# Patient Record
Sex: Male | Born: 1995
Health system: Southern US, Community
[De-identification: ages and names within clinical notes are randomized; demographics above are authoritative.]

## PROBLEM LIST (undated history)

## (undated) DIAGNOSIS — D229 Melanocytic nevi, unspecified: Secondary | ICD-10-CM

## (undated) HISTORY — DX: Melanocytic nevi, unspecified: D22.9

---
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
## 2014-01-18 ENCOUNTER — Encounter: Payer: Self-pay | Admitting: Internal Medicine

## 2014-01-18 ENCOUNTER — Ambulatory Visit (INDEPENDENT_AMBULATORY_CARE_PROVIDER_SITE_OTHER): Payer: Self-pay | Admitting: Internal Medicine

## 2014-01-18 VITALS — BP 98/62 | HR 71 | Temp 98.2°F | Wt 132.8 lb

## 2014-01-18 DIAGNOSIS — T148XXA Other injury of unspecified body region, initial encounter: Secondary | ICD-10-CM

## 2014-01-18 MED ORDER — NAPROXEN 250 MG PO TABS: 250.0000 mg | ORAL_TABLET | Freq: Two times a day (BID) | ORAL | Status: DC

## 2014-01-18 NOTE — Progress Notes (Signed)
Pre visit review using our clinic review tool, if applicable. No additional management support is needed unless otherwise documented below in the visit note. 

## 2014-01-18 NOTE — Patient Instructions (Addendum)
Muscle Strain A muscle strain is an injury that occurs when a muscle is stretched beyond its normal length. Usually a small number of muscle fibers are torn when this happens. Muscle strain is rated in degrees. First-degree strains have the least amount of muscle fiber tearing and pain. Second-degree and third-degree strains have increasingly more tearing and pain.  Usually, recovery from muscle strain takes 1-2 weeks. Complete healing takes 5-6 weeks.  CAUSES  Muscle strain happens when a sudden, violent force placed on a muscle stretches it too far. This may occur with lifting, sports, or a fall.  RISK FACTORS Muscle strain is especially common in athletes.  SIGNS AND SYMPTOMS At the site of the muscle strain, there may be:  Pain.  Bruising.  Swelling.  Difficulty using the muscle due to pain or lack of normal function. DIAGNOSIS  Your health care provider will perform a physical exam and ask about your medical history. TREATMENT  Often, the best treatment for a muscle strain is resting, icing, and applying cold compresses to the injured area.  HOME CARE INSTRUCTIONS   Use the PRICE method of treatment to promote muscle healing during the first 2-3 days after your injury. The PRICE method involves:  Protecting the muscle from being injured again.  Restricting your activity and resting the injured body part.  Icing your injury. To do this, put ice in a plastic bag. Place a towel between your skin and the bag. Then, apply the ice and leave it on from 15-20 minutes each hour. After the third day, switch to moist heat packs.  Apply compression to the injured area with a splint or elastic bandage. Be careful not to wrap it too tightly. This may interfere with blood circulation or increase swelling.  Elevate the injured body part above the level of your heart as often as you can.  Only take over-the-counter or prescription medicines for pain, discomfort, or fever as directed by your  health care provider.  Warming up prior to exercise helps to prevent future muscle strains. SEEK MEDICAL CARE IF:   You have increasing pain or swelling in the injured area.  You have numbness, tingling, or a significant loss of strength in the injured area. MAKE SURE YOU:   Understand these instructions.  Will watch your condition.  Will get help right away if you are not doing well or get worse. Document Released: 05/13/2005 Document Revised: 03/03/2013 Document Reviewed: 12/10/2012 [facility] Patient Information 2015 [place], [place]. This information is not intended to replace advice given to you by your health care provider. Make sure you discuss any questions you have with your health care provider. Back Exercises These exercises may help you when beginning to rehabilitate your injury. Your symptoms may resolve with or without further involvement from your physician, physical therapist or athletic trainer. While completing these exercises, remember:   Restoring tissue flexibility helps normal motion to return to the joints. This allows healthier, less painful movement and activity.  An effective stretch should be held for at least 30 seconds.  A stretch should never be painful. You should only feel a gentle lengthening or release in the stretched tissue. STRETCH - Extension, Prone on Elbows   Lie on your stomach on the floor, a bed will be too soft. Place your palms about shoulder width apart and at the height of your head.  Place your elbows under your shoulders. If this is too painful, stack pillows under your chest.  Allow your body to relax  so that your hips drop lower and make contact more completely with the floor.  Hold this position for __________ seconds.  Slowly return to lying flat on the floor. Repeat __________ times. Complete this exercise __________ times per day.  RANGE OF MOTION - Extension, Prone Press Ups   Lie on your stomach on the floor, a bed will be  too soft. Place your palms about shoulder width apart and at the height of your head.  Keeping your back as relaxed as possible, slowly straighten your elbows while keeping your hips on the floor. You may adjust the placement of your hands to maximize your comfort. As you gain motion, your hands will come more underneath your shoulders.  Hold this position __________ seconds.  Slowly return to lying flat on the floor. Repeat __________ times. Complete this exercise __________ times per day.  RANGE OF MOTION- Quadruped, Neutral Spine   Assume a hands and knees position on a firm surface. Keep your hands under your shoulders and your knees under your hips. You may place padding under your knees for comfort.  Drop your head and point your tail bone toward the ground below you. This will round out your low back like an angry cat. Hold this position for __________ seconds.  Slowly lift your head and release your tail bone so that your back sags into a large arch, like an old horse.  Hold this position for __________ seconds.  Repeat this until you feel limber in your low back.  Now, find your "sweet spot." This will be the most comfortable position somewhere between the two previous positions. This is your neutral spine. Once you have found this position, tense your stomach muscles to support your low back.  Hold this position for __________ seconds. Repeat __________ times. Complete this exercise __________ times per day.  STRETCH - Flexion, Single Knee to Chest   Lie on a firm bed or floor with both legs extended in front of you.  Keeping one leg in contact with the floor, bring your opposite knee to your chest. Hold your leg in place by either grabbing behind your thigh or at your knee.  Pull until you feel a gentle stretch in your low back. Hold __________ seconds.  Slowly release your grasp and repeat the exercise with the opposite side. Repeat __________ times. Complete this  exercise __________ times per day.  STRETCH - Hamstrings, Standing  Stand or sit and extend your right / left leg, placing your foot on a chair or foot stool  Keeping a slight arch in your low back and your hips straight forward.  Lead with your chest and lean forward at the waist until you feel a gentle stretch in the back of your right / left knee or thigh. (When done correctly, this exercise requires leaning only a small distance.)  Hold this position for __________ seconds. Repeat __________ times. Complete this stretch __________ times per day. STRENGTHENING - Deep Abdominals, Pelvic Tilt   Lie on a firm bed or floor. Keeping your legs in front of you, bend your knees so they are both pointed toward the ceiling and your feet are flat on the floor.  Tense your lower abdominal muscles to press your low back into the floor. This motion will rotate your pelvis so that your tail bone is scooping upwards rather than pointing at your feet or into the floor.  With a gentle tension and even breathing, hold this position for __________ seconds. Repeat __________  times. Complete this exercise __________ times per day.  STRENGTHENING - Abdominals, Crunches   Lie on a firm bed or floor. Keeping your legs in front of you, bend your knees so they are both pointed toward the ceiling and your feet are flat on the floor. Cross your arms over your chest.  Slightly tip your chin down without bending your neck.  Tense your abdominals and slowly lift your trunk high enough to just clear your shoulder blades. Lifting higher can put excessive stress on the low back and does not further strengthen your abdominal muscles.  Control your return to the starting position. Repeat __________ times. Complete this exercise __________ times per day.  STRENGTHENING - Quadruped, Opposite UE/LE Lift   Assume a hands and knees position on a firm surface. Keep your hands under your shoulders and your knees under your  hips. You may place padding under your knees for comfort.  Find your neutral spine and gently tense your abdominal muscles so that you can maintain this position. Your shoulders and hips should form a rectangle that is parallel with the floor and is not twisted.  Keeping your trunk steady, lift your right hand no higher than your shoulder and then your left leg no higher than your hip. Make sure you are not holding your breath. Hold this position __________ seconds.  Continuing to keep your abdominal muscles tense and your back steady, slowly return to your starting position. Repeat with the opposite arm and leg. Repeat __________ times. Complete this exercise __________ times per day. Document Released: 05/31/2005 Document Revised: 08/05/2011 Document Reviewed: 08/25/2008 [facility] Patient Information 2015 [place], [place]. This information is not intended to replace advice given to you by your health care provider. Make sure you discuss any questions you have with your health care provider.

## 2014-01-18 NOTE — Progress Notes (Signed)
Subjective:    Patient ID: Eduardo Whitaker, male    DOB: [birth year], 18 y.o.   MRN: [id number]  HPI  Pt presents to the clinic today with pain in the right side of his back. This started 1 week ago. He describes the pain as achy. He denies any specific injury to the area but reports that he has been working out.  Review of Systems      Past Medical History  Diagnosis Date  . Atypical nevus     prev removed by derm    No current outpatient prescriptions on file.   No current facility-administered medications for this visit.    No Known Allergies  Family History  Problem Relation Age of Onset  . Depression Mother   . Hypertension Father   . Prostate cancer Maternal Grandfather   . Colon cancer Neg Hx     History   Social History  . Marital Status: Single    Spouse Name: N/A    Number of Children: N/A  . Years of Education: N/A   Occupational History  . Not on file.   Social History Main Topics  . Smoking status: Never Smoker   . Smokeless tobacco: Never Used  . Alcohol Use: No  . Drug Use: No  . Sexual Activity: Not on file   Other Topics Concern  . Not on file   Social History Narrative   Visual merchandiser, criminal justice   Likes to hunt, work out     Constitutional: Denies fever, malaise, fatigue, headache or abrupt weight changes.  GU: Denies urgency, frequency, pain with urination, burning sensation, blood in urine, odor or discharge. Musculoskeletal: Pt reports back pain.  Denies decrease in range of motion, difficulty with gait, or joint pain and swelling.    No other specific complaints in a complete review of systems (except as listed in HPI above).  Objective:   Physical Exam   BP 98/62  Pulse 71  Temp(Src) 98.2 F (36.8 C) (Oral)  Wt 132 lb 12 oz (60.215 kg)  SpO2 98% Wt Readings from Last 3 Encounters:  01/18/14 132 lb 12 oz (60.215 kg) (16%*, Z = -1.01)  12/21/13 135 lb (61.236 kg) (19%*, Z = -0.88)   * Growth percentiles are  based on CDC 2-20 Years data.    General: Appears his stated age, well developed, well nourished in NAD. Cardiovascular: Normal rate and rhythm. S1,S2 noted.  No murmur, rubs or gallops noted. No JVD or BLE edema. No carotid bruits noted. Pulmonary/Chest: Normal effort and positive vesicular breath sounds. No respiratory distress. No wheezes, rales or ronchi noted.  Musculoskeletal: Normal flexion, extension and rotation of the spine. He does have pain with rotation to the left and flexion. Nontender with palpation.     BMET    Component Value Date/Time   NA 137 12/08/2010 2126   K 4.5 12/08/2010 2126   CL 102 12/08/2010 2126   CO2 24 12/08/2010 2126   GLUCOSE 83 12/08/2010 2126   BUN 19 12/08/2010 2126   CREATININE 0.69 12/08/2010 2126   CALCIUM 10.1 12/08/2010 2126   GFRNONAA NOT CALCULATED 12/08/2010 2126   GFRAA NOT CALCULATED 12/08/2010 2126    Lipid Panel  No results found for this basename: chol, trig, hdl, cholhdl, vldl, ldlcalc    CBC    Component Value Date/Time   WBC 11.4 12/08/2010 2126   RBC 6.45* 12/08/2010 2126   HGB 17.7* 12/08/2010 2126   HCT 49.8* 12/08/2010 2126  PLT PLATELET CLUMPS NOTED ON SMEAR, UNABLE TO ESTIMATE 12/08/2010 2126   MCV 77.2* 12/08/2010 2126   MCH 27.4 12/08/2010 2126   MCHC 35.5 12/08/2010 2126   RDW 13.0 12/08/2010 2126   LYMPHSABS 1.8 12/08/2010 2126   MONOABS 0.9 12/08/2010 2126   EOSABS 0.1 12/08/2010 2126   BASOSABS 0.0 12/08/2010 2126    Hgb A1C No results found for this basename: HGBA1C        Assessment & Plan:   Muscle Strain of back:  Try Naproxen BID for the next 3 days with meals Stretching exercises given Avoid any heavy lifting or upper body workouts for the next week A heating pad may be helpful  RTC as needed or if symptoms persist > 2 more weeks

## 2014-01-28 ENCOUNTER — Encounter: Payer: Self-pay | Admitting: Family Medicine

## 2014-03-04 ENCOUNTER — Encounter: Payer: Self-pay | Admitting: Family Medicine

## 2014-03-04 ENCOUNTER — Ambulatory Visit (INDEPENDENT_AMBULATORY_CARE_PROVIDER_SITE_OTHER): Payer: Self-pay | Admitting: Family Medicine

## 2014-03-04 VITALS — BP 128/66 | HR 66 | Temp 98.1°F | Wt 134.2 lb

## 2014-03-04 DIAGNOSIS — J069 Acute upper respiratory infection, unspecified: Secondary | ICD-10-CM

## 2014-03-04 MED ORDER — BENZONATATE 200 MG PO CAPS: 200.0000 mg | ORAL_CAPSULE | Freq: Three times a day (TID) | ORAL | Status: DC | PRN

## 2014-03-04 NOTE — Progress Notes (Signed)
Pre visit review using our clinic review tool, if applicable. No additional management support is needed unless otherwise documented below in the visit note.  Sx started about 4-5 days ago.  Had ST at that point. Sinus drainage also. Stuffy and runny nose the next day.  Start mucinex at that point. Now with AM cough and sputum, dark green. Less sputum later in the day.  No fevers.  No wheeze.  No ear pain, no vomiting.  No rash.  Hoarse.  Frequently clearing throat.  He is less stuffy now.   Meds, vitals, and allergies reviewed.   ROS: See HPI.  Otherwise, noncontributory.  GEN: nad, alert and oriented HEENT: mucous membranes moist, tm w/o erythema, nasal exam w/o erythema, clear discharge noted,  OP with cobblestoning, sinuses not ttp NECK: supple w/o LA CV: rrr.   PULM: ctab, no inc wob EXT: no edema SKIN: no acute rash

## 2014-03-04 NOTE — Patient Instructions (Signed)
Use tessalon for the cough.   Take mucinex with plenty of fluids.  Rest your voice.  Try to get some rest.  Glad to see you.  You should get better in a few more days.

## 2014-03-06 DIAGNOSIS — J069 Acute upper respiratory infection, unspecified: Secondary | ICD-10-CM | POA: Insufficient documentation

## 2014-03-06 NOTE — Assessment & Plan Note (Signed)
Likely viral, nontoxic, already feels some better today.  See instructions.  D/w pt.

## 2014-05-17 ENCOUNTER — Ambulatory Visit: Payer: Medicaid Other | Admitting: Family Medicine

## 2014-08-22 ENCOUNTER — Telehealth: Payer: Self-pay | Admitting: Family Medicine

## 2014-08-22 DIAGNOSIS — R6889 Other general symptoms and signs: Secondary | ICD-10-CM

## 2014-08-22 NOTE — Telephone Encounter (Signed)
Mom advised

## 2014-08-22 NOTE — Telephone Encounter (Signed)
Referral placed.  I don't know how quickly he can be seen there.  If he can't be seen soon, we can see him in the office first.  I presume she [facility], Dr. Pryor Ochoa.

## 2014-08-22 NOTE — Telephone Encounter (Signed)
Mom called wanting to get a referral to ent Dr Mackie Pai stated his ears are stopped up and needs appointment asap Please call mom with date and time of appointment No insurance Mom called to make appointment she stated he needs referral

## 2014-08-26 ENCOUNTER — Emergency Department (HOSPITAL_BASED_OUTPATIENT_CLINIC_OR_DEPARTMENT_OTHER)
Admission: EM | Admit: 2014-08-26 | Discharge: 2014-08-26 | Disposition: A | Discharge status: Home / Self Care | Payer: Medicaid Other | Attending: Emergency Medicine | Admitting: Emergency Medicine

## 2014-08-26 ENCOUNTER — Emergency Department (HOSPITAL_BASED_OUTPATIENT_CLINIC_OR_DEPARTMENT_OTHER): Payer: Medicaid Other

## 2014-08-26 ENCOUNTER — Encounter (HOSPITAL_BASED_OUTPATIENT_CLINIC_OR_DEPARTMENT_OTHER): Payer: Self-pay | Admitting: *Deleted

## 2014-08-26 DIAGNOSIS — X58XXXA Exposure to other specified factors, initial encounter: Secondary | ICD-10-CM | POA: Diagnosis not present

## 2014-08-26 DIAGNOSIS — Z79899 Other long term (current) drug therapy: Secondary | ICD-10-CM | POA: Insufficient documentation

## 2014-08-26 DIAGNOSIS — Y9289 Other specified places as the place of occurrence of the external cause: Secondary | ICD-10-CM | POA: Diagnosis not present

## 2014-08-26 DIAGNOSIS — Y9389 Activity, other specified: Secondary | ICD-10-CM | POA: Insufficient documentation

## 2014-08-26 DIAGNOSIS — Y998 Other external cause status: Secondary | ICD-10-CM | POA: Insufficient documentation

## 2014-08-26 DIAGNOSIS — S63617A Unspecified sprain of left little finger, initial encounter: Secondary | ICD-10-CM | POA: Insufficient documentation

## 2014-08-26 DIAGNOSIS — Z86018 Personal history of other benign neoplasm: Secondary | ICD-10-CM | POA: Insufficient documentation

## 2014-08-26 DIAGNOSIS — R110 Nausea: Secondary | ICD-10-CM | POA: Diagnosis not present

## 2014-08-26 DIAGNOSIS — S63619A Unspecified sprain of unspecified finger, initial encounter: Secondary | ICD-10-CM

## 2014-08-26 DIAGNOSIS — S6992XA Unspecified injury of left wrist, hand and finger(s), initial encounter: Secondary | ICD-10-CM | POA: Diagnosis present

## 2014-08-26 DIAGNOSIS — Z791 Long term (current) use of non-steroidal anti-inflammatories (NSAID): Secondary | ICD-10-CM | POA: Diagnosis not present

## 2014-08-26 NOTE — ED Notes (Signed)
Pt was exercising with a tractor tire and injured his left pinky finger.

## 2014-08-26 NOTE — ED Provider Notes (Signed)
CSN: [id number]     Arrival date & time 08/26/14  1929 History   First MD Initiated Contact with Patient 08/26/14 1947     Chief Complaint  Patient presents with  . Hand Pain     (Consider location/radiation/quality/duration/timing/severity/associated sxs/prior Treatment) Patient is a 19 y.o. male presenting with hand pain. The history is provided by the patient. No language interpreter was used.  Hand Pain This is a new problem. The current episode started today. The problem occurs constantly. The problem has been unchanged. Associated symptoms include joint swelling, myalgias and nausea. Nothing aggravates the symptoms. He has tried nothing for the symptoms. The treatment provided moderate relief.  Pt complains of pain in little finger,  Pt was flipped a tractor tire and finger was pulled backwards  Past Medical History  Diagnosis Date  . Atypical nevus     prev removed by derm   History reviewed. No pertinent past surgical history. Family History  Problem Relation Age of Onset  . Depression Mother   . Hypertension Father   . Prostate cancer Maternal Grandfather   . Colon cancer Neg Hx    History  Substance Use Topics  . Smoking status: Never Smoker   . Smokeless tobacco: Never Used  . Alcohol Use: No    Review of Systems  Gastrointestinal: Positive for nausea.  Musculoskeletal: Positive for myalgias and joint swelling.  All other systems reviewed and are negative.     Allergies  Review of patient's allergies indicates no known allergies.  Home Medications   Prior to Admission medications   Medication Sig Start Date End Date Taking? Authorizing Provider  benzonatate (TESSALON) 200 MG capsule Take 1 capsule (200 mg total) by mouth 3 (three) times daily as needed. 03/04/14   Tonia Ghent, MD  naproxen (NAPROSYN) 250 MG tablet Take 1 tablet (250 mg total) by mouth 2 (two) times daily with a meal. 01/18/14   Jearld Fenton, NP   BP 131/84 mmHg  Pulse 60  Temp(Src)  98.2 F (36.8 C) (Oral)  Resp 18  Ht 5\' 7"  (1.702 m)  Wt 135 lb (61.236 kg)  BMI 21.14 kg/m2  SpO2 100% Physical Exam  Constitutional: He appears well-developed and well-nourished.  Musculoskeletal: He exhibits tenderness.  Swollen left 5th finger.  From  Ns and nv intact  Neurological: He is alert.  Skin: Skin is warm.  Psychiatric: He has a normal mood and affect.  Nursing note and vitals reviewed.   ED Course  Procedures (including critical care time) Labs Review Labs Reviewed - No data to display  Imaging Review Dg Finger Little Left  08/26/2014   CLINICAL DATA:  Injured while exercising. Pain at the MCP joint region.  EXAM: LEFT LITTLE FINGER 2+V  COMPARISON:  None  FINDINGS: No evidence of fracture or dislocation.  IMPRESSION: Negative.   Electronically Signed   By: Nelson Chimes M.D.   On: 08/26/2014 20:28     EKG Interpretation None      MDM   Final diagnoses:  Sprain of finger, left, initial encounter    Splint Ibuprofen Follow up with Dr. Barbaraann Barthel in 4-5 days    Fransico Meadow, PA-C 08/26/14 2036  Alfonzo Beers, MD 08/26/14 2037

## 2014-08-26 NOTE — Telephone Encounter (Signed)
08/26/14-Called [facility]; pt has [place] Medicaid and has [facility] listed as his PCP. Spoke to mother about this. She states she thought he did not have medicaid anymore but she is going to look into that and let us know. I informed pt mother we cannot do any referrals for patients who have [place] Medicaid. Pt mother verbalized understanding. Referral cancelled -arc

## 2014-08-26 NOTE — Discharge Instructions (Signed)
Finger Sprain  A finger sprain is a tear in one of the strong, fibrous tissues that connect the bones (ligaments) in your finger. The severity of the sprain depends on how much of the ligament is torn. The tear can be either partial or complete.  CAUSES   Often, sprains are a result of a fall or accident. If you extend your hands to catch an object or to protect yourself, the force of the impact causes the fibers of your ligament to stretch too much. This excess tension causes the fibers of your ligament to tear.  SYMPTOMS   You may have some loss of motion in your finger. Other symptoms include:   Bruising.   Tenderness.   Swelling.  DIAGNOSIS   In order to diagnose finger sprain, your caregiver will physically examine your finger or thumb to determine how torn the ligament is. Your caregiver may also suggest an X-ray exam of your finger to make sure no bones are broken.  TREATMENT   If your ligament is only partially torn, treatment usually involves keeping the finger in a fixed position (immobilization) for a short period. To do this, your caregiver will apply a bandage, cast, or splint to keep your finger from moving until it heals. For a partially torn ligament, the healing process usually takes 2 to 3 weeks.  If your ligament is completely torn, you may need surgery to reconnect the ligament to the bone. After surgery a cast or splint will be applied and will need to stay on your finger or thumb for 4 to 6 weeks while your ligament heals.  HOME CARE INSTRUCTIONS   Keep your injured finger elevated, when possible, to decrease swelling.   To ease pain and swelling, apply ice to your joint twice a day, for 2 to 3 days:   Put ice in a plastic bag.   Place a towel between your skin and the bag.   Leave the ice on for 15 minutes.   Only take over-the-counter or prescription medicine for pain as directed by your caregiver.   Do not wear rings on your injured finger.   Do not leave your finger unprotected  until pain and stiffness go away (usually 3 to 4 weeks).   Do not allow your cast or splint to get wet. Cover your cast or splint with a plastic bag when you shower or bathe. Do not swim.   Your caregiver may suggest special exercises for you to do during your recovery to prevent or limit permanent stiffness.  SEEK IMMEDIATE MEDICAL CARE IF:   Your cast or splint becomes damaged.   Your pain becomes worse rather than better.  MAKE SURE YOU:   Understand these instructions.   Will watch your condition.   Will get help right away if you are not doing well or get worse.  Document Released: 06/20/2004 Document Revised: 08/05/2011 Document Reviewed: 01/14/2011  [employer] Patient Information 2015 [employer]. This information is not intended to replace advice given to you by your health care provider. Make sure you discuss any questions you have with your health care provider.

## 2014-09-09 ENCOUNTER — Other Ambulatory Visit (INDEPENDENT_AMBULATORY_CARE_PROVIDER_SITE_OTHER): Payer: Medicaid Other

## 2014-09-09 ENCOUNTER — Other Ambulatory Visit: Payer: Self-pay | Admitting: Family Medicine

## 2014-09-09 DIAGNOSIS — Z131 Encounter for screening for diabetes mellitus: Secondary | ICD-10-CM

## 2014-09-09 DIAGNOSIS — Z13220 Encounter for screening for lipoid disorders: Secondary | ICD-10-CM

## 2014-09-09 LAB — LIPID PANEL
CHOL/HDL RATIO: 2
Cholesterol: 93 mg/dL (ref 0–200)
HDL: 40 mg/dL (ref >39.00)
LDL CALC: 36 mg/dL (ref 0–99)
NONHDL: 53
Triglycerides: 87 mg/dL (ref 0.0–149.0)
VLDL: 17.4 mg/dL (ref 0.0–40.0)

## 2014-09-09 LAB — BASIC METABOLIC PANEL
BUN: 16 mg/dL (ref 6–23)
CALCIUM: 9.7 mg/dL (ref 8.4–10.5)
CO2: 28 mEq/L (ref 19–32)
CREATININE: 0.91 mg/dL (ref 0.40–1.50)
Chloride: 105 mEq/L (ref 96–112)
GFR: 112.68 mL/min (ref >60.00)
Glucose, Bld: 85 mg/dL (ref 70–99)
POTASSIUM: 4.2 meq/L (ref 3.5–5.1)
Sodium: 139 mEq/L (ref 135–145)

## 2014-09-16 ENCOUNTER — Encounter: Payer: Self-pay | Admitting: Family Medicine

## 2014-09-16 ENCOUNTER — Ambulatory Visit (INDEPENDENT_AMBULATORY_CARE_PROVIDER_SITE_OTHER): Payer: Medicaid Other | Admitting: Family Medicine

## 2014-09-16 VITALS — BP 110/64 | HR 60 | Temp 98.4°F | Ht 66.75 in | Wt 135.8 lb

## 2014-09-16 DIAGNOSIS — Z021 Encounter for pre-employment examination: Secondary | ICD-10-CM | POA: Diagnosis not present

## 2014-09-16 DIAGNOSIS — Z0000 Encounter for general adult medical examination without abnormal findings: Secondary | ICD-10-CM

## 2014-09-16 DIAGNOSIS — Z00129 Encounter for routine child health examination without abnormal findings: Secondary | ICD-10-CM

## 2014-09-16 DIAGNOSIS — Z23 Encounter for immunization: Secondary | ICD-10-CM | POA: Diagnosis not present

## 2014-09-16 LAB — POCT URINALYSIS DIPSTICK
Bilirubin, UA: NEGATIVE
Glucose, UA: NEGATIVE
KETONES UA: NEGATIVE
LEUKOCYTES UA: NEGATIVE
Nitrite, UA: NEGATIVE
PH UA: 6
PROTEIN UA: NEGATIVE
RBC UA: NEGATIVE
Spec Grav, UA: >=1.03
Urobilinogen, UA: 2

## 2014-09-16 NOTE — Progress Notes (Signed)
Pre visit review using our clinic review tool, if applicable. No additional management support is needed unless otherwise documented below in the visit note.  CPE- See plan.  Routine anticipatory guidance given to patient.  See health maintenance. See scanned forms for BLET entrance.   Flu encouraged tdap 2016 Diet and exercise d/w pt.  Colon/prostate cancer screening not due.  PNA and shingles vaccine not due.  Labs wnl, d/w pt.   PMH and SH reviewed  Meds, vitals, and allergies reviewed.   ROS: See HPI.  Otherwise negative.    GEN: nad, alert and oriented HEENT: mucous membranes moist NECK: supple w/o LA CV: rrr. PULM: ctab, no inc wob ABD: soft, +bs EXT: no edema SKIN: no acute rash

## 2014-09-16 NOTE — Patient Instructions (Signed)
Take care.  Glad to see you.  I would get a flu shot each fall.

## 2014-09-19 DIAGNOSIS — Z0000 Encounter for general adult medical examination without abnormal findings: Secondary | ICD-10-CM | POA: Insufficient documentation

## 2014-09-19 NOTE — Assessment & Plan Note (Signed)
Routine anticipatory guidance given to patient.  See health maintenance. See scanned forms for BLET entrance.   Flu encouraged tdap 2016 Diet and exercise d/w pt.  Colon/prostate cancer screening not due.  PNA and shingles vaccine not due.  Labs wnl, d/w pt.

## 2014-12-27 ENCOUNTER — Telehealth: Payer: Self-pay

## 2014-12-27 NOTE — Telephone Encounter (Signed)
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 Pt request cb.

## 2014-12-27 NOTE — Telephone Encounter (Signed)
I would check with the [employer] [facility].  They may be able to get him tested.  If they can't help, let me know.  Thanks.

## 2014-12-27 NOTE — Telephone Encounter (Signed)
Patient advised.

## 2015-06-13 ENCOUNTER — Ambulatory Visit (INDEPENDENT_AMBULATORY_CARE_PROVIDER_SITE_OTHER): Payer: Self-pay | Admitting: Family Medicine

## 2015-06-13 ENCOUNTER — Encounter: Payer: Self-pay | Admitting: Family Medicine

## 2015-06-13 VITALS — BP 102/60 | HR 63 | Temp 98.4°F | Wt 137.2 lb

## 2015-06-13 DIAGNOSIS — N509 Disorder of male genital organs, unspecified: Secondary | ICD-10-CM

## 2015-06-13 DIAGNOSIS — N5089 Other specified disorders of the male genital organs: Secondary | ICD-10-CM

## 2015-06-13 NOTE — Progress Notes (Signed)
Pre visit review using our clinic review tool, if applicable. No additional management support is needed unless otherwise documented below in the visit note.  Noted a spot when getting out of the shower, about 2 days ago.  The L testicle looked "pointy" and he thought he felt a lump.  He thinks the spot is in the testicle not in the skin.  No Sx on the R side.  No pain.  No pain with with urination.  No rash.  No FCNAVD.    PMH and SH reviewed  ROS: See HPI, otherwise noncontributory.  Meds, vitals, and allergies reviewed.   nad Testes bilaterally descended without nodularity, tenderness or masses felt. No scrotal masses or lesions. No penis lesions or urethral discharge. No skin lesion, no rash

## 2015-06-13 NOTE — Patient Instructions (Signed)
Go up to the front and ask about referrals.  We will call about your ultrasound.  Take care.  We'll be in touch.

## 2015-06-14 ENCOUNTER — Encounter: Payer: Self-pay | Admitting: Family Medicine

## 2015-06-14 ENCOUNTER — Ambulatory Visit
Admission: RE | Admit: 2015-06-14 | Discharge: 2015-06-14 | Disposition: A | Discharge status: Home / Self Care | Payer: Medicaid Other | Source: Ambulatory Visit | Attending: Family Medicine | Admitting: Family Medicine

## 2015-06-14 DIAGNOSIS — N432 Other hydrocele: Secondary | ICD-10-CM | POA: Diagnosis not present

## 2015-06-14 DIAGNOSIS — N5089 Other specified disorders of the male genital organs: Secondary | ICD-10-CM

## 2015-06-14 DIAGNOSIS — N509 Disorder of male genital organs, unspecified: Secondary | ICD-10-CM | POA: Insufficient documentation

## 2015-06-14 NOTE — Assessment & Plan Note (Signed)
Normal exam, d/w pt.  Would still get u/s to r/o sig pathology.  Addendum- Negative/normal scrotal ultrasound. No left testicular mass or abnormality identified. No evidence of testicular torsion or unilateral inflammation. Whatever he noted prev was normal and not an issue. Nothing else to do at this point.   Can f/u prn.

## 2015-08-05 ENCOUNTER — Emergency Department (HOSPITAL_BASED_OUTPATIENT_CLINIC_OR_DEPARTMENT_OTHER)
Admission: EM | Admit: 2015-08-05 | Discharge: 2015-08-05 | Disposition: A | Discharge status: Home / Self Care | Payer: Self-pay | Attending: Emergency Medicine | Admitting: Emergency Medicine

## 2015-08-05 ENCOUNTER — Encounter (HOSPITAL_BASED_OUTPATIENT_CLINIC_OR_DEPARTMENT_OTHER): Payer: Self-pay | Admitting: *Deleted

## 2015-08-05 ENCOUNTER — Emergency Department (HOSPITAL_BASED_OUTPATIENT_CLINIC_OR_DEPARTMENT_OTHER): Payer: Self-pay

## 2015-08-05 DIAGNOSIS — Z86018 Personal history of other benign neoplasm: Secondary | ICD-10-CM | POA: Insufficient documentation

## 2015-08-05 DIAGNOSIS — R0789 Other chest pain: Secondary | ICD-10-CM | POA: Insufficient documentation

## 2015-08-05 NOTE — ED Notes (Signed)
DC instructions reviewed with pt, opportunity for questions provided

## 2015-08-05 NOTE — ED Notes (Signed)
MD at bedside. 

## 2015-08-05 NOTE — ED Notes (Signed)
Pt reports substernal pinpoint pain worsening when laying flat or taking a deep breath.

## 2015-08-05 NOTE — ED Notes (Signed)
MD requested blood pressures to be taken in both arms. Right arm 135/90 & left arm 135/86.

## 2015-08-05 NOTE — Discharge Instructions (Signed)
Nonspecific Chest Pain  It is okay to continue to take ibuprofen as directed for your pain. We don't feel that it is of a serious or dangerous cause. Contact Dr. Damita Dunnings to arrange to be seen in the office if you continue to have significant pain in a week Chest pain can be caused by many different conditions. There is always a chance that your pain could be related to something serious, such as a heart attack or a blood clot in your lungs. Chest pain can also be caused by conditions that are not life-threatening. If you have chest pain, it is very important to follow up with your health care provider. CAUSES  Chest pain can be caused by:  Heartburn.  Pneumonia or bronchitis.  Anxiety or stress.  Inflammation around your heart (pericarditis) or lung (pleuritis or pleurisy).  A blood clot in your lung.  A collapsed lung (pneumothorax). It can develop suddenly on its own (spontaneous pneumothorax) or from trauma to the chest.  Shingles infection (varicella-zoster virus).  Heart attack.  Damage to the bones, muscles, and cartilage that make up your chest wall. This can include:  Bruised bones due to injury.  Strained muscles or cartilage due to frequent or repeated coughing or overwork.  Fracture to one or more ribs.  Sore cartilage due to inflammation (costochondritis). RISK FACTORS  Risk factors for chest pain may include:  Activities that increase your risk for trauma or injury to your chest.  Respiratory infections or conditions that cause frequent coughing.  Medical conditions or overeating that can cause heartburn.  Heart disease or family history of heart disease.  Conditions or health behaviors that increase your risk of developing a blood clot.  Having had chicken pox (varicella zoster). SIGNS AND SYMPTOMS Chest pain can feel like:  Burning or tingling on the surface of your chest or deep in your chest.  Crushing, pressure, aching, or squeezing pain.  Dull or  sharp pain that is worse when you move, cough, or take a deep breath.  Pain that is also felt in your back, neck, shoulder, or arm, or pain that spreads to any of these areas. Your chest pain may come and go, or it may stay constant. DIAGNOSIS Lab tests or other studies may be needed to find the cause of your pain. Your health care provider may have you take a test called an ambulatory ECG (electrocardiogram). An ECG records your heartbeat patterns at the time the test is performed. You may also have other tests, such as:  Transthoracic echocardiogram (TTE). During echocardiography, sound waves are used to create a picture of all of the heart structures and to look at how blood flows through your heart.  Transesophageal echocardiogram (TEE).This is a more advanced imaging test that obtains images from inside your body. It allows your health care provider to see your heart in finer detail.  Cardiac monitoring. This allows your health care provider to monitor your heart rate and rhythm in real time.  Holter monitor. This is a portable device that records your heartbeat and can help to diagnose abnormal heartbeats. It allows your health care provider to track your heart activity for several days, if needed.  Stress tests. These can be done through exercise or by taking medicine that makes your heart beat more quickly.  Blood tests.  Imaging tests. TREATMENT  Your treatment depends on what is causing your chest pain. Treatment may include:  Medicines. These may include:  Acid blockers for heartburn.  Anti-inflammatory  medicine.  Pain medicine for inflammatory conditions.  Antibiotic medicine, if an infection is present.  Medicines to dissolve blood clots.  Medicines to treat coronary artery disease.  Supportive care for conditions that do not require medicines. This may include:  Resting.  Applying heat or cold packs to injured areas.  Limiting activities until pain  decreases. HOME CARE INSTRUCTIONS  If you were prescribed an antibiotic medicine, finish it all even if you start to feel better.  Avoid any activities that bring on chest pain.  Do not use any tobacco products, including cigarettes, chewing tobacco, or electronic cigarettes. If you need help quitting, ask your health care provider.  Do not drink alcohol.  Take medicines only as directed by your health care provider.  Keep all follow-up visits as directed by your health care provider. This is important. This includes any further testing if your chest pain does not go away.  If heartburn is the cause for your chest pain, you may be told to keep your head raised (elevated) while sleeping. This reduces the chance that acid will go from your stomach into your esophagus.  Make lifestyle changes as directed by your health care provider. These may include:  Getting regular exercise. Ask your health care provider to suggest some activities that are safe for you.  Eating a heart-healthy diet. A registered dietitian can help you to learn healthy eating options.  Maintaining a healthy weight.  Managing diabetes, if necessary.  Reducing stress. SEEK MEDICAL CARE IF:  Your chest pain does not go away after treatment.  You have a rash with blisters on your chest.  You have a fever. SEEK IMMEDIATE MEDICAL CARE IF:   Your chest pain is worse.  You have an increasing cough, or you cough up blood.  You have severe abdominal pain.  You have severe weakness.  You faint.  You have chills.  You have sudden, unexplained chest discomfort.  You have sudden, unexplained discomfort in your arms, back, neck, or jaw.  You have shortness of breath at any time.  You suddenly start to sweat, or your skin gets clammy.  You feel nauseous or you vomit.  You suddenly feel light-headed or dizzy.  Your heart begins to beat quickly, or it feels like it is skipping beats. These symptoms may  represent a serious problem that is an emergency. Do not wait to see if the symptoms will go away. Get medical help right away. Call your local emergency services (911 in the [place] Do not drive yourself to the hospital.   This information is not intended to replace advice given to you by your health care provider. Make sure you discuss any questions you have with your health care provider.   Document Released: 02/20/2005 Document Revised: 06/03/2014 Document Reviewed: 12/17/2013 Elsevier Interactive Patient Education [employer].

## 2015-08-05 NOTE — ED Provider Notes (Signed)
CSN: [id number]     Arrival date & time 08/05/15  1240 History   First MD Initiated Contact with Patient 08/05/15 1337     Chief Complaint  Patient presents with  . Chest Pain     (Consider location/radiation/quality/duration/timing/severity/associated sxs/prior Treatment) HPI Complains of chest pain onset 1 week ago last for a split second at a time. Pain is at left-sided parasternal area immediately adjacent to the xiphoid process. Pain is exacerbated by taking deep breath or by changing positions. No shortness of breath no nausea no sweatiness. No other associated symptoms. Treated with ibuprofen with partial relief. Patient does regular aerobic vigorous exercise such as running without chest pain. No cough Past Medical History  Diagnosis Date  . Atypical nevus     prev removed by derm   History reviewed. No pertinent past surgical history. Family History  Problem Relation Age of Onset  . Depression Mother   . Hypertension Father   . Prostate cancer Maternal Grandfather   . Colon cancer Neg Hx   No family history of cardiac disease Social History  Substance Use Topics  . Smoking status: Never Smoker   . Smokeless tobacco: Never Used  . Alcohol Use: No    Review of Systems  Constitutional: Negative.   HENT: Negative.   Respiratory: Negative.   Cardiovascular: Positive for chest pain.  Gastrointestinal: Negative.   Musculoskeletal: Negative.   Skin: Negative.   Neurological: Negative.   Psychiatric/Behavioral: Negative.   All other systems reviewed and are negative.     Allergies  Review of patient's allergies indicates no known allergies.  Home Medications   Prior to Admission medications   Not on File   BP 133/81 mmHg  Pulse 68  Temp(Src) 98.3 F (36.8 C) (Oral)  Resp 18  Ht 5\' 6"  (1.676 m)  Wt 140 lb (63.504 kg)  BMI 22.61 kg/m2  SpO2 99% Physical Exam  Constitutional: He appears well-developed and well-nourished.  HENT:  Head: Normocephalic and  atraumatic.  Eyes: Conjunctivae are normal. Pupils are equal, round, and reactive to light.  Neck: Neck supple. No tracheal deviation present. No thyromegaly present.  Cardiovascular: Normal rate and regular rhythm.   No murmur heard. Pulmonary/Chest: Effort normal and breath sounds normal. He exhibits tenderness.  Tender at left border of xiphoid process, reproducing pain exactly. Pain is also reproduced by forcible flexion of left shoulder  Abdominal: Soft. Bowel sounds are normal. He exhibits no distension. There is no tenderness.  Musculoskeletal: Normal range of motion. He exhibits no edema or tenderness.  Neurological: He is alert. Coordination normal.  Skin: Skin is warm and dry. No rash noted.  Psychiatric: He has a normal mood and affect.  Nursing note and vitals reviewed.   ED Course  Procedures (including critical care time) Labs Review Labs Reviewed - No data to display  Imaging Review Dg Chest 2 View  08/05/2015  CLINICAL DATA:  Left anterior chest pain after shoveling gravel. Pain with movement. EXAM: CHEST - 2 VIEW COMPARISON:  None. FINDINGS: The heart size and mediastinal contours are within normal limits. Both lungs are clear. The visualized skeletal structures are unremarkable. IMPRESSION: Negative two view chest x-ray. Electronically Signed   By: San Morelle M.D.   On: 08/05/2015 13:16   I have personally reviewed and evaluated these images and lab results as part of my medical decision-making.   EKG Interpretation   Date/Time:  Saturday August 05 2015 12:50:19 EST Ventricular Rate:  66 PR Interval:  100  QRS Duration: 80 QT Interval:  376 QTC Calculation: 394 R Axis:   92 Text Interpretation:  Sinus rhythm with short PR Rightward axis Borderline  ECG No old tracing to compare Confirmed by Winfred Leeds  MD, Luisalberto Beegle [phone]) on  08/05/2015 12:50:54 PM     Chest x-ray viewed by me. Results for orders placed or performed in visit on 09/16/14  POCT Urinalysis  Dipstick  Result Value Ref Range   Color, UA Yellow    Clarity, UA Clear    Glucose, UA Negative    Bilirubin, UA Negative    Ketones, UA Negative    Spec Grav, UA >=1.030    Blood, UA Negative    pH, UA 6.0    Protein, UA Negative    Urobilinogen, UA 2.0    Nitrite, UA Negative    Leukocytes, UA Negative    Dg Chest 2 View  08/05/2015  CLINICAL DATA:  Left anterior chest pain after shoveling gravel. Pain with movement. EXAM: CHEST - 2 VIEW COMPARISON:  None. FINDINGS: The heart size and mediastinal contours are within normal limits. Both lungs are clear. The visualized skeletal structures are unremarkable. IMPRESSION: Negative two view chest x-ray. Electronically Signed   By: San Morelle M.D.   On: 08/05/2015 13:16    MDM  Patient has perc negative. Exam is consistent with musculoskeletal chest pain. Plan continue ibuprofen follow-up with his PMD if having significant pain 1 week Diagnosis atypical chest pain Final diagnoses:  None        Orlie Dakin, MD 08/05/15 1407

## 2015-09-19 ENCOUNTER — Encounter: Payer: Medicaid Other | Admitting: Family Medicine

## 2015-09-19 DIAGNOSIS — Z0289 Encounter for other administrative examinations: Secondary | ICD-10-CM

## 2015-09-20 ENCOUNTER — Ambulatory Visit (INDEPENDENT_AMBULATORY_CARE_PROVIDER_SITE_OTHER): Payer: Self-pay | Admitting: Primary Care

## 2015-09-20 ENCOUNTER — Encounter: Payer: Self-pay | Admitting: Primary Care

## 2015-09-20 VITALS — BP 110/70 | HR 60 | Temp 98.1°F | Ht 66.75 in | Wt 138.4 lb

## 2015-09-20 DIAGNOSIS — L089 Local infection of the skin and subcutaneous tissue, unspecified: Secondary | ICD-10-CM

## 2015-09-20 MED ORDER — CEPHALEXIN 500 MG PO CAPS: 500.0000 mg | ORAL_CAPSULE | Freq: Two times a day (BID) | ORAL | Status: DC

## 2015-09-20 NOTE — Progress Notes (Signed)
   Subjective:    Patient ID: Eduardo Whitaker, male    DOB: [birth year], 20 y.o.   MRN: [id number]  HPI  Eduardo Whitaker is a 20 year old male who presents today with a chief complaint of finger pain. His pain is located to the right 4th digit. He first noticed swelling and pain on Friday last week. His pain and swelling began to the tip of his finger but has developed redness and pain down to the entire 4th digit. He doesn't remember cutting or injuring his finger last week, but has noticed a small cut to the area of concern. He works Pensions consultant.   Denies fevers, numbness/tingling. He's applied peroxide and neosporin, cleaned his hand with soap, and is protecting with a Band-Aid without significant improvement. He's popped the raised area on his finger with expulsion of puss. The wound will regrow larger each time he pops it.   Review of Systems  Constitutional: Negative for fever.  Skin: Positive for color change and wound.  Neurological: Negative for numbness.       Past Medical History  Diagnosis Date  . Atypical nevus     prev removed by derm     Social History   Social History  . Marital Status: Single    Spouse Name: N/A  . Number of Children: N/A  . Years of Education: N/A   Occupational History  . Not on file.   Social History Main Topics  . Smoking status: Never Smoker   . Smokeless tobacco: Never Used  . Alcohol Use: No  . Drug Use: No  . Sexual Activity: Not on file   Other Topics Concern  . Not on file   Social History Narrative   Visual merchandiser, criminal justice   Likes to hunt, work out    No past surgical history on file.  Family History  Problem Relation Age of Onset  . Depression Mother   . Hypertension Father   . Prostate cancer Maternal Grandfather   . Colon cancer Neg Hx     No Known Allergies  No current outpatient prescriptions on file prior to visit.   No current facility-administered medications on file prior to visit.     BP 110/70 mmHg  Pulse 60  Temp(Src) 98.1 F (36.7 C) (Oral)  Ht 5' 6.75" (1.695 m)  Wt 138 lb 6.4 oz (62.778 kg)  BMI 21.85 kg/m2  SpO2 98%    Objective:   Physical Exam  Constitutional: He appears well-nourished.  Cardiovascular: Normal rate and regular rhythm.   Pulmonary/Chest: Effort normal and breath sounds normal.  Skin: Skin is dry.  Mild to moderate erythema to tip of 4th right digit of hand. Small pustule present. Also appears to be involving finger nail line.           Assessment & Plan:  Finger infection/ Paronychia:  Located to right hand, 4th digit.  Moderate erythema, likely due to small cut to finger from cardboard box. Tdap UTD. Does appear infectious. Start Cephalexin BID x 7 days. Epsom salt soaks, protect while at work. Follow up PRN.

## 2015-09-20 NOTE — Patient Instructions (Signed)
Start Cephalexin antibiotics. Take 1 capsule by mouth twice daily for 7 days.  Soak your finger in warm water and epsom salt 2-3 times daily.  Continue to protect your finger during the day with a Band-Aid.   Please notify me if no improvement in 3-4 days.  It was a pleasure meeting you!

## 2015-09-20 NOTE — Progress Notes (Signed)
Pre visit review using our clinic review tool, if applicable. No additional management support is needed unless otherwise documented below in the visit note. 

## 2015-09-21 ENCOUNTER — Encounter: Payer: Self-pay | Admitting: Family Medicine

## 2015-12-11 ENCOUNTER — Encounter (HOSPITAL_BASED_OUTPATIENT_CLINIC_OR_DEPARTMENT_OTHER): Payer: Self-pay

## 2015-12-11 ENCOUNTER — Emergency Department (HOSPITAL_BASED_OUTPATIENT_CLINIC_OR_DEPARTMENT_OTHER)
Admission: EM | Admit: 2015-12-11 | Discharge: 2015-12-11 | Disposition: A | Discharge status: Home / Self Care | Payer: Medicaid Other | Attending: Emergency Medicine | Admitting: Emergency Medicine

## 2015-12-11 DIAGNOSIS — H6123 Impacted cerumen, bilateral: Secondary | ICD-10-CM | POA: Insufficient documentation

## 2015-12-11 NOTE — Discharge Instructions (Signed)
Cerumen Impaction The structures of the external ear canal secrete a waxy substance known as cerumen. Excess cerumen can build up in the ear canal, causing a condition known as cerumen impaction. Cerumen impaction can cause ear pain and disrupt the function of the ear. The rate of cerumen production differs for each individual. In certain individuals, the configuration of the ear canal may decrease his or her ability to naturally remove cerumen. CAUSES Cerumen impaction is caused by excessive cerumen production or buildup. RISK FACTORS  Frequent use of swabs to clean ears.  Having narrow ear canals.  Having eczema.  Being dehydrated. SIGNS AND SYMPTOMS  Diminished hearing.  Ear drainage.  Ear pain.  Ear itch. TREATMENT Treatment may involve:  Over-the-counter or prescription ear drops to soften the cerumen.  Removal of cerumen by a health care provider. This may be done with:  Irrigation with warm water. This is the most common method of removal.  Ear curettes and other instruments.  Surgery. This may be done in severe cases. HOME CARE INSTRUCTIONS  Take medicines only as directed by your health care provider.  Do not insert objects into the ear with the intent of cleaning the ear. PREVENTION  Do not insert objects into the ear, even with the intent of cleaning the ear. Removing cerumen as a part of normal hygiene is not necessary, and the use of swabs in the ear canal is not recommended.  Drink enough water to keep your urine clear or pale yellow.  Control your eczema if you have it. SEEK MEDICAL CARE IF:  You develop ear pain.  You develop bleeding from the ear.  The cerumen does not clear after you use ear drops as directed.   This information is not intended to replace advice given to you by your health care provider. Make sure you discuss any questions you have with your health care provider.   Document Released: 06/20/2004 Document Revised: 06/03/2014  Document Reviewed: 12/28/2014 Elsevier Interactive Patient Education 2016 Elsevier Inc[employer]  

## 2015-12-11 NOTE — ED Provider Notes (Signed)
CSN: [id number]     Arrival date & time 12/11/15  0401 History   First MD Initiated Contact with Patient 12/11/15 0409     Chief Complaint  Patient presents with  . Otalgia     (Consider location/radiation/quality/duration/timing/severity/associated sxs/prior Treatment) Patient is a 20 y.o. male presenting with ear pain. The history is provided by the patient.  Otalgia Location:  Bilateral Behind ear:  No abnormality Quality:  Dull Severity:  Severe Onset quality:  Gradual Timing:  Constant Progression:  Worsening Chronicity:  Recurrent Context: water   Context: not direct blow   Relieved by:  Nothing Worsened by:  Nothing tried Ineffective treatments:  None tried Associated symptoms: no abdominal pain and no fever   Risk factors: no recent travel     Past Medical History  Diagnosis Date  . Atypical nevus     prev removed by derm   History reviewed. No pertinent past surgical history. Family History  Problem Relation Age of Onset  . Depression Mother   . Hypertension Father   . Prostate cancer Maternal Grandfather   . Colon cancer Neg Hx    Social History  Substance Use Topics  . Smoking status: Never Smoker   . Smokeless tobacco: Never Used  . Alcohol Use: No    Review of Systems  Constitutional: Negative for fever.  HENT: Positive for ear pain. Negative for drooling.   Gastrointestinal: Negative for abdominal pain.  All other systems reviewed and are negative.     Allergies  Review of patient's allergies indicates no known allergies.  Home Medications   Prior to Admission medications   Medication Sig Start Date End Date Taking? Authorizing Provider  cephALEXin (KEFLEX) 500 MG capsule Take 1 capsule (500 mg total) by mouth 2 (two) times daily. 09/20/15   Pleas Koch, NP   BP 134/93 mmHg  Pulse 72  Temp(Src) 98.2 F (36.8 C) (Oral)  Resp 16  Ht 5\' 6"  (1.676 m)  Wt 140 lb (63.504 kg)  BMI 22.61 kg/m2  SpO2 100% Physical Exam   Constitutional: He is oriented to person, place, and time. He appears well-developed and well-nourished. No distress.  HENT:  Head: Normocephalic and atraumatic.  Mouth/Throat: Oropharynx is clear and moist.  Eyes: Conjunctivae are normal. Pupils are equal, round, and reactive to light.  Neck: Normal range of motion. Neck supple.  Cardiovascular: Normal rate and regular rhythm.   Pulmonary/Chest: Effort normal and breath sounds normal. He has no wheezes. He has no rales.  Abdominal: Soft. Bowel sounds are normal. There is no tenderness. There is no rebound and no guarding.  Musculoskeletal: Normal range of motion.  Neurological: He is alert and oriented to person, place, and time.  Skin: Skin is warm and dry.  Psychiatric: He has a normal mood and affect.    ED Course  Procedures (including critical care time) Labs Review Labs Reviewed - No data to display  Imaging Review No results found. I have personally reviewed and evaluated these images and lab results as part of my medical decision-making.   EKG Interpretation None      MDM   Final diagnoses:  None   Filed Vitals:   12/11/15 0405  BP: 134/93  Pulse: 72  Temp: 98.2 F (36.8 C)  Resp: 16    Ears irrigated in the ED as impacted with wax B, B TM and canals are normal  All questions answered to patient's parents satisfaction. Based on history and exam patient has been appropriately  medically screened and emergency conditions excluded. Patient is stable for discharge at this time. Follow up provided and strict return precautions given     Mirjana Tarleton, MD 12/11/15 [id number]

## 2015-12-11 NOTE — ED Notes (Signed)
MD Palumbo at bedside. 

## 2015-12-11 NOTE — ED Notes (Signed)
Pt reports bilateral ear pain since yesterday while at a pool party. Reports left > right.

## 2016-05-24 ENCOUNTER — Encounter: Payer: Self-pay | Admitting: Family Medicine

## 2016-05-24 ENCOUNTER — Ambulatory Visit (INDEPENDENT_AMBULATORY_CARE_PROVIDER_SITE_OTHER): Payer: Self-pay | Admitting: Family Medicine

## 2016-05-24 DIAGNOSIS — Z029 Encounter for administrative examinations, unspecified: Secondary | ICD-10-CM

## 2016-05-24 NOTE — Patient Instructions (Signed)
Happy new year.  Drink plenty of fluids, take ibuprofen as needed, and gargle with warm salt water for your throat.  Okay to take mucinex with a lot of fluid.  This should gradually improve.  Take care.  Let us know if you have other concerns.   I would get a flu shot each fall.   Update me as needed.  Take care.  Glad to see you.

## 2016-05-24 NOTE — Progress Notes (Signed)
Pre visit review using our clinic review tool, if applicable. No additional management support is needed unless otherwise documented below in the visit note. 

## 2016-05-24 NOTE — Progress Notes (Signed)
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   He has a mild head cold with some congestion and sinus drainage that started yesterday, but other than that he feels well No chest pain. No shortness of breath. No contraindication to exercise.  He has normal testing for color vision. His hearing is normal in whisper testing bilaterally. Vision testing-  Left eye 20/15  with1 mistake , right eye- 20/15.   Meds, vitals, and allergies reviewed.   ROS: Per HPI unless specifically indicated in ROS section   GEN: nad, alert and oriented HEENT: mucous membranes moist, tm w/o erythema, nasal exam w/o erythema, clear discharge noted,  OP with cobblestoning NECK: supple w/o LA CV: rrr.   PULM: ctab, no inc wob EXT: no edema SKIN: no acute rash

## 2016-05-27 ENCOUNTER — Encounter: Payer: Self-pay | Admitting: Family Medicine

## 2016-05-27 DIAGNOSIS — Z029 Encounter for administrative examinations, unspecified: Secondary | ICD-10-CM | POA: Insufficient documentation

## 2016-05-27 NOTE — Assessment & Plan Note (Signed)
For preemployment eval.  No contraindication to exercise and no reason medically to prevent participation in future employment.  Flu shot encouraged for when he no longer has URI sx.  URI should resolve with supportive care.  D/w pt.  Nontoxic, see AVS.  He agrees.  Update me as needed.

## 2016-05-28 ENCOUNTER — Other Ambulatory Visit: Payer: Self-pay | Admitting: Family Medicine

## 2016-05-28 MED ORDER — AMOXICILLIN-POT CLAVULANATE 875-125 MG PO TABS: 1.0000 | ORAL_TABLET | Freq: Two times a day (BID) | ORAL | 0 refills | Status: DC

## 2016-10-05 ENCOUNTER — Encounter: Payer: Self-pay | Admitting: Family Medicine

## 2016-10-16 ENCOUNTER — Encounter: Payer: Self-pay | Admitting: Family Medicine

## 2016-12-12 ENCOUNTER — Encounter: Payer: Self-pay | Admitting: Family Medicine

## 2017-01-12 ENCOUNTER — Encounter: Payer: Self-pay | Admitting: Family Medicine

## 2017-01-15 ENCOUNTER — Other Ambulatory Visit: Payer: Self-pay | Admitting: Family Medicine

## 2017-01-15 MED ORDER — BENZONATATE 200 MG PO CAPS: 200.0000 mg | ORAL_CAPSULE | Freq: Three times a day (TID) | ORAL | 0 refills | Status: DC | PRN

## 2017-01-15 MED ORDER — ALBUTEROL SULFATE HFA 108 (90 BASE) MCG/ACT IN AERS: 2.0000 | INHALATION_SPRAY | Freq: Four times a day (QID) | RESPIRATORY_TRACT | 0 refills | Status: DC | PRN

## 2017-01-30 ENCOUNTER — Encounter: Payer: Self-pay | Admitting: Family Medicine

## 2017-02-04 ENCOUNTER — Encounter: Payer: Self-pay | Admitting: Family Medicine

## 2017-02-04 ENCOUNTER — Ambulatory Visit (INDEPENDENT_AMBULATORY_CARE_PROVIDER_SITE_OTHER): Payer: Self-pay | Admitting: Family Medicine

## 2017-02-04 VITALS — BP 102/62 | HR 55 | Temp 98.5°F | Ht 66.0 in | Wt 137.0 lb

## 2017-02-04 DIAGNOSIS — Z23 Encounter for immunization: Secondary | ICD-10-CM

## 2017-02-04 DIAGNOSIS — Z029 Encounter for administrative examinations, unspecified: Secondary | ICD-10-CM

## 2017-02-04 LAB — POC URINALSYSI DIPSTICK (AUTOMATED)
BILIRUBIN UA: NEGATIVE
Blood, UA: NEGATIVE
GLUCOSE UA: NEGATIVE
Ketones, UA: NEGATIVE
LEUKOCYTES UA: NEGATIVE
NITRITE UA: NEGATIVE
Protein, UA: NEGATIVE
Spec Grav, UA: >=1.03 — AB (ref 1.010–1.025)
Urobilinogen, UA: 0.2 E.U./dL
pH, UA: 6.5 (ref 5.0–8.0)

## 2017-02-04 NOTE — Patient Instructions (Signed)
Take care.  Glad to see you. Update me as needed.  

## 2017-02-05 NOTE — Progress Notes (Signed)
Administrative encounter. 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 No symptoms or items in his history that would keep him out of participation. No fevers chills nausea vomiting diarrhea, etc. He feels well. See scanned forms for details.  PMH and SH reviewed  ROS: Per HPI unless specifically indicated in ROS section   Meds, vitals, and allergies reviewed.   GEN: nad, alert and oriented HEENT: mucous membranes moist NECK: supple w/o LA CV: rrr.  no murmur PULM: ctab, no inc wob ABD: soft, +bs EXT: no edema SKIN: no acute rash

## 2017-02-05 NOTE — Assessment & Plan Note (Signed)
Flu vaccine done today. Discussed with patient. Other vaccines up-to-date. Form filled out. See scanned forms for details. Okay to pertussis update. Update me as needed. >15 minutes spent in face to face time with patient, >50% spent in counselling or coordination of care

## 2017-05-07 IMAGING — US US SCROTUM
1 series · 14 of 25 positions shown · non-contrast
Comparison: None.

CLINICAL DATA: 19-year-old male with palpable abnormality left
scrotum discovered 5 days ago. Non painful. Initial encounter.

EXAM:
SCROTAL ULTRASOUND
DOPPLER ULTRASOUND OF THE TESTICLES
TECHNIQUE: Complete ultrasound examination of the testicles, epididymis, and
other scrotal structures was performed. Color and spectral Doppler
ultrasound were also utilized to evaluate blood flow to the
testicles.

[Series 1: us scrotum · 0.07mm/px · 14 of 84 slices shown]
[im 1/84]
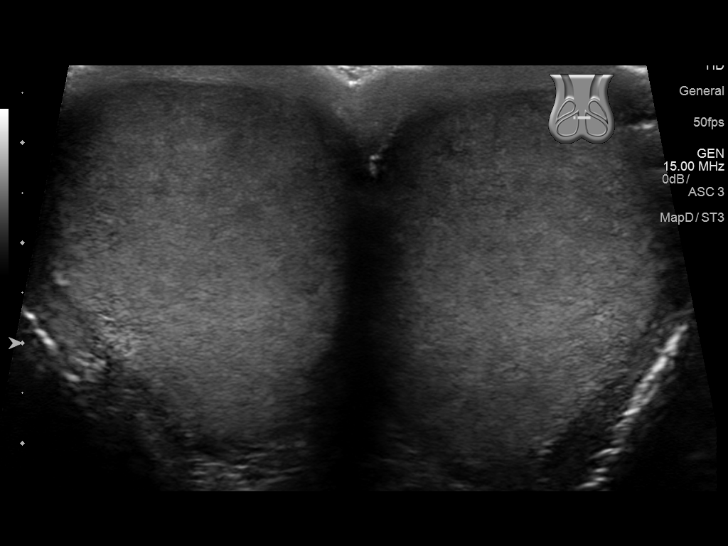
[im 7/84]
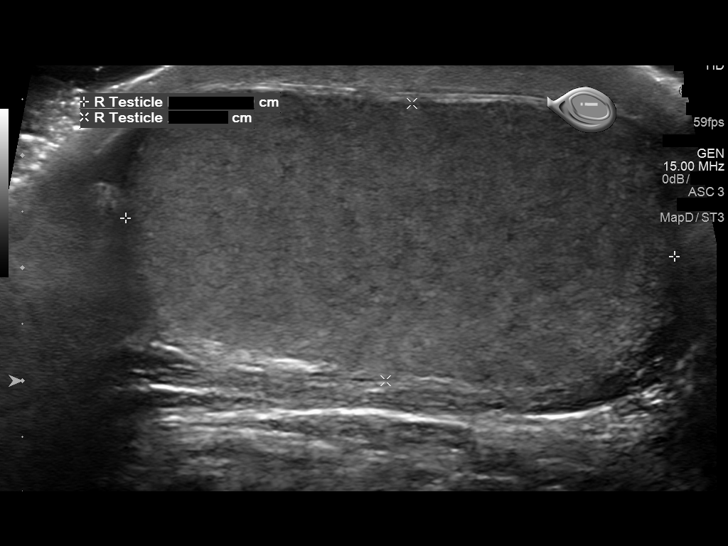
[im 14/84]
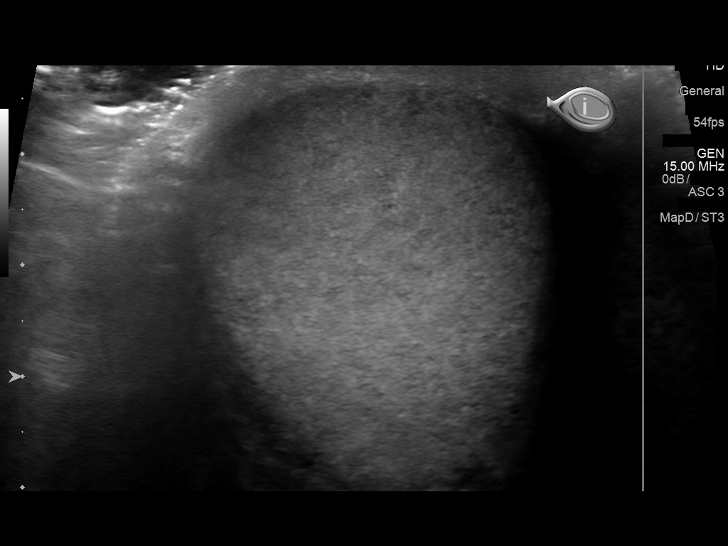
[im 21/84]
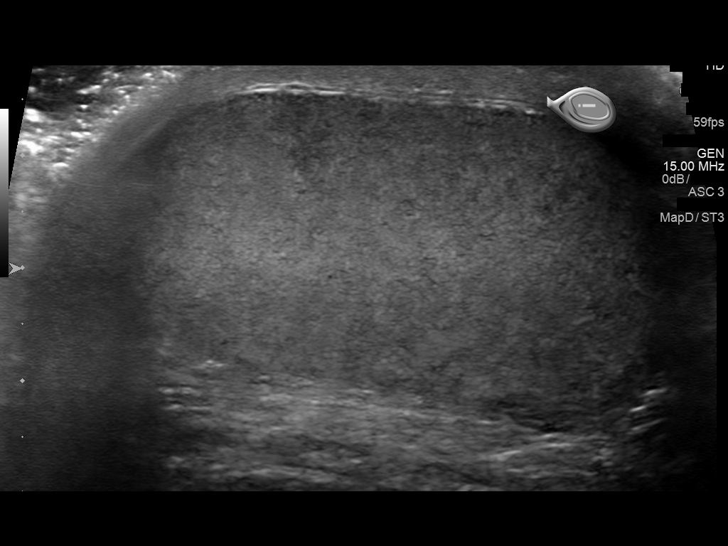
[im 28/84]
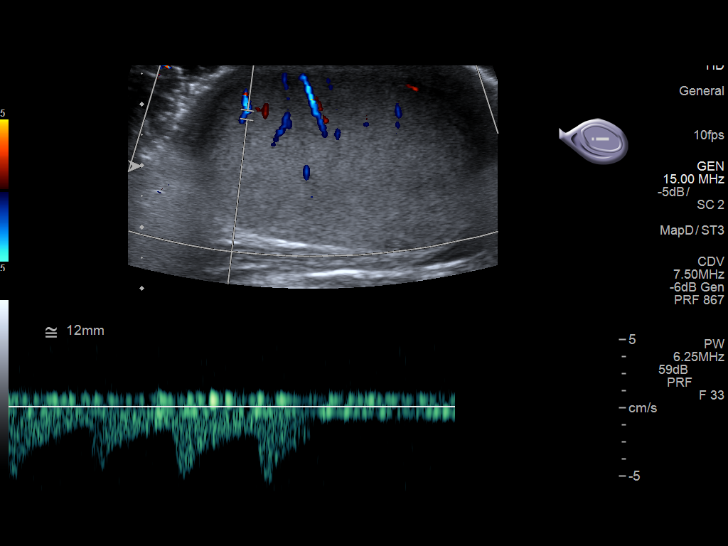
[im 32/84]
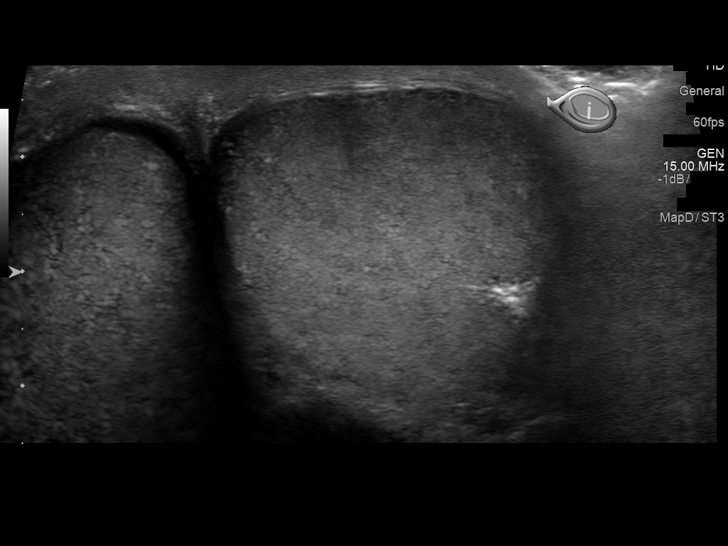
[im 39/84]
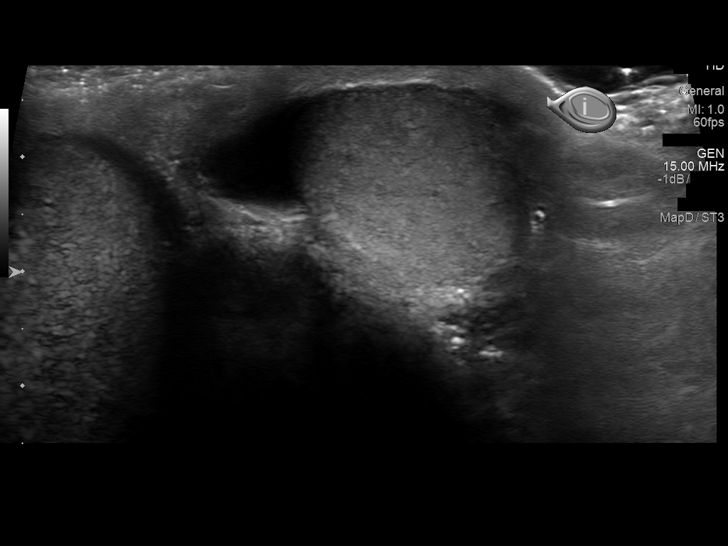
[im 45/84]
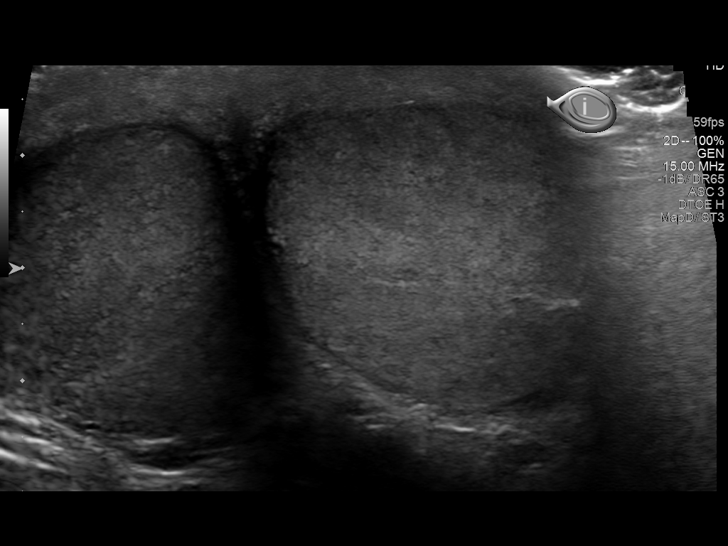
[im 52/84]
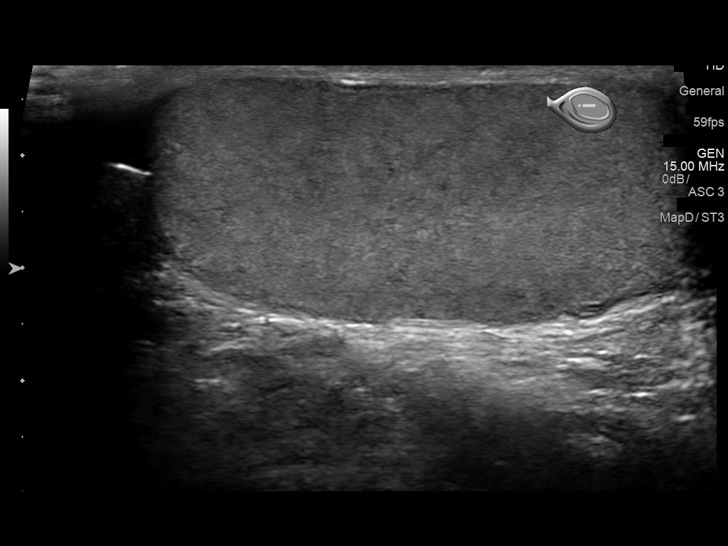
[im 56/84]
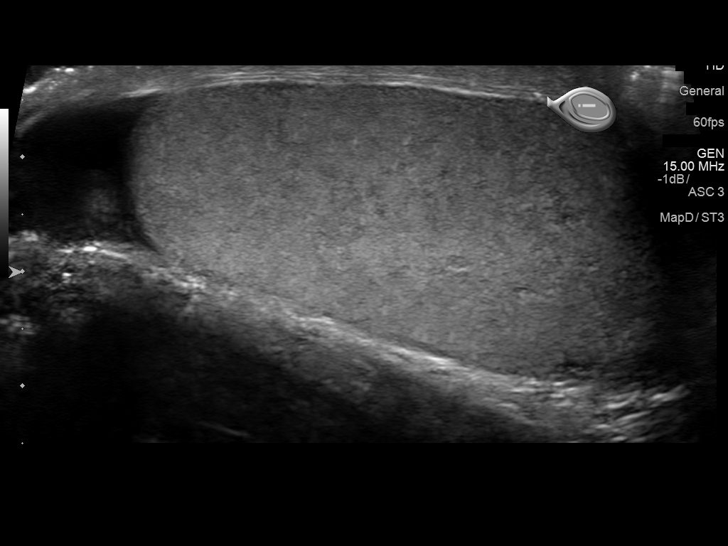
[im 63/84]
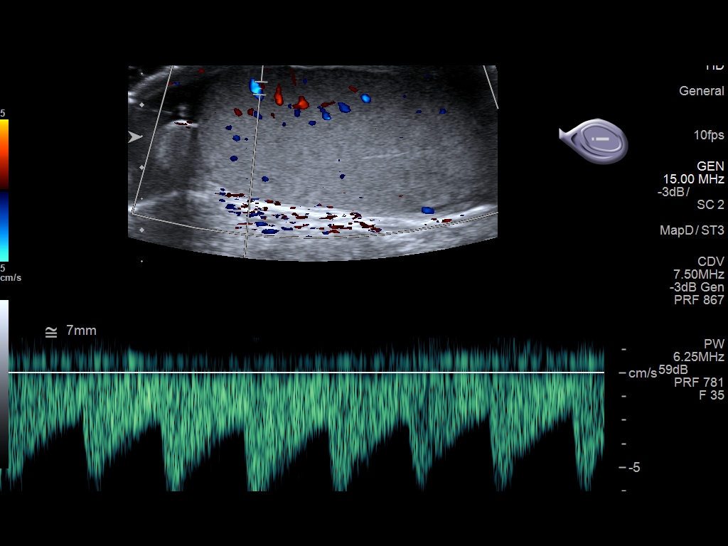
[im 70/84]
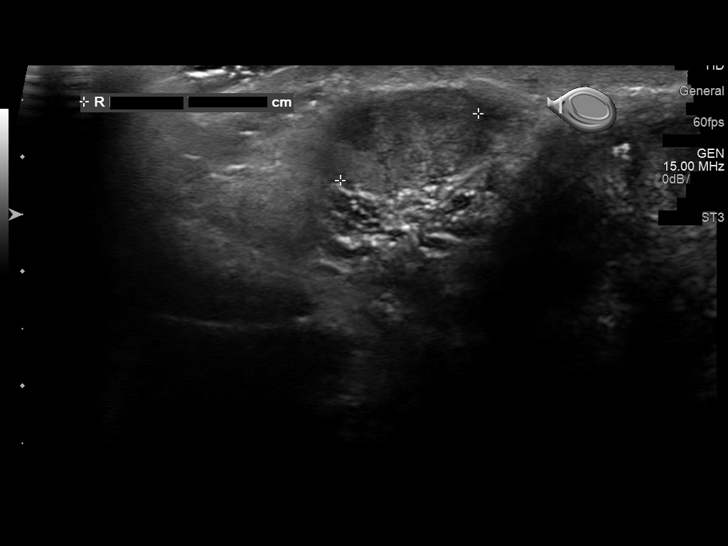
[im 77/84]
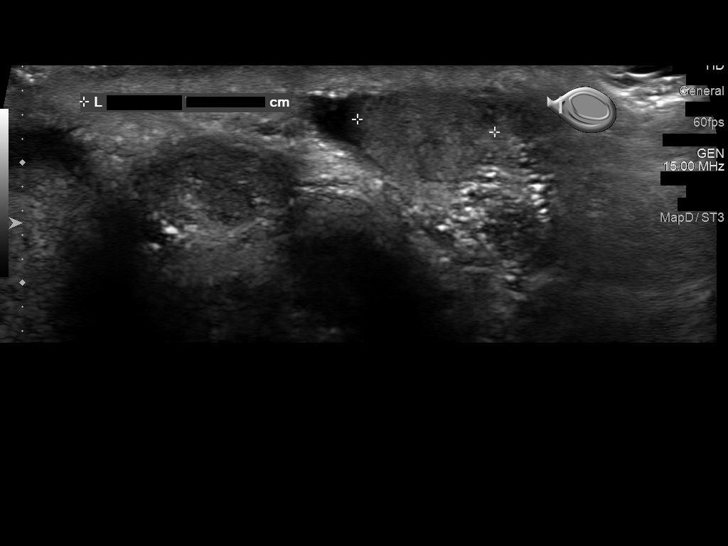
[im 84/84]
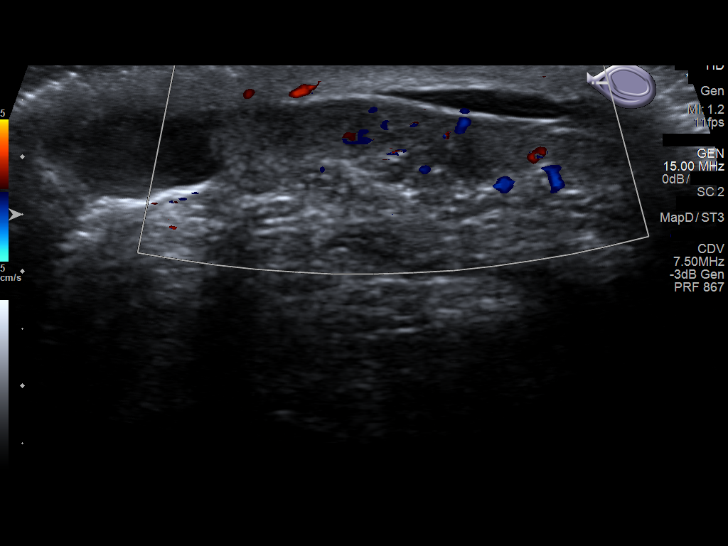

[14 of 25 positions shown; findings below may reference images not displayed]

FINDINGS: Right testicle

Measurements: 4.9 x 2.5 x 3.1 cm. No mass or microlithiasis
visualized.

Left testicle

Measurements: 4.7 x 2.3 x 2.9 cm. Normal echotexture.. No mass or
microlithiasis visualized.

Right epididymis:  Normal in size and appearance.

Left epididymis:  Normal in size and appearance.

Hydrocele:  Small simple appearing bilateral hydroceles.

Varicocele:  None visualized.

Pulsed Doppler interrogation of both testes demonstrates normal low
resistance arterial and venous waveforms bilaterally.
IMPRESSION: Negative scrotal ultrasound. No left testicular mass or abnormality
identified. No evidence of testicular torsion or unilateral
inflammation.

## 2017-06-20 ENCOUNTER — Encounter: Payer: Self-pay | Admitting: Family Medicine

## 2017-10-19 ENCOUNTER — Encounter: Payer: Self-pay | Admitting: Family Medicine

## 2018-01-07 ENCOUNTER — Encounter: Payer: Self-pay | Admitting: Family Medicine

## 2018-01-27 ENCOUNTER — Encounter: Payer: Self-pay | Admitting: Family Medicine

## 2018-01-29 ENCOUNTER — Encounter: Payer: Self-pay | Admitting: Family Medicine

## 2018-03-05 ENCOUNTER — Encounter: Payer: Self-pay | Admitting: Family Medicine

## 2018-04-27 ENCOUNTER — Ambulatory Visit: Payer: 59 | Admitting: Family Medicine

## 2018-04-27 ENCOUNTER — Encounter: Payer: Self-pay | Admitting: Family Medicine

## 2018-04-27 VITALS — BP 102/62 | HR 67 | Temp 98.7°F | Ht 66.0 in | Wt 134.5 lb

## 2018-04-27 DIAGNOSIS — R251 Tremor, unspecified: Secondary | ICD-10-CM | POA: Diagnosis not present

## 2018-04-27 LAB — BASIC METABOLIC PANEL
BUN: 11 mg/dL (ref 6–23)
CALCIUM: 10.5 mg/dL (ref 8.4–10.5)
CHLORIDE: 103 meq/L (ref 96–112)
CO2: 27 meq/L (ref 19–32)
Creatinine, Ser: 0.89 mg/dL (ref 0.40–1.50)
GFR: 111.79 mL/min (ref >60.00)
Glucose, Bld: 89 mg/dL (ref 70–99)
POTASSIUM: 4.6 meq/L (ref 3.5–5.1)
SODIUM: 141 meq/L (ref 135–145)

## 2018-04-27 LAB — TSH: TSH: 1.41 u[IU]/mL (ref 0.35–4.50)

## 2018-04-27 NOTE — Progress Notes (Signed)
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  D/w pt.    He is L handed.    He felt a little jittery prev and noted episodic tremor in the L>R hand.  It was never severe but noted by the patient.  Noted episodically over the last few weeks.  Unclear if related to muscle fatigue related to typing, since he is doing that more.   He drinks coffee, that may make it worse.  Worse if he hasn't had breakfast.  He drinks etoh rarely.  No illicits.    He had prev qualified with pistol, shotgun, AR, w/o troubles.  He can still target shoot w/o troubles.  No foot tremor with walking.  No jaw tremor.  No dysphagia.  No weakness, was able to carry a christmas tree up 3 flights of stairs.    He'll get a flu shot at work.   PMH and SH reviewed  ROS: Per HPI unless specifically indicated in ROS section   Meds, vitals, and allergies reviewed.   GEN: nad, alert and oriented HEENT: mucous membranes moist NECK: supple w/o LA CV: rrr PULM: ctab, no inc wob ABD: soft, +bs EXT: no edema SKIN: no acute rash CN 2-12 wnl B, S/S/DTR wnl x4 No resting tremor.  He has some mild tremor with static L>R hand extension, but then it can diminish after a few seconds.

## 2018-04-27 NOTE — Patient Instructions (Signed)
This may be a combination of caffeine and relatively low sugar intake in the AMs.   Gradually cut back on caffeine, don't quit cold [place].   Try to get some protein (nuts, etc) for breakfast.  Go to the lab on the way out.  We'll contact you with your lab report. Take care.  Glad to see you.  If you continue to have troubles, then we may need to have you see Dr. Carles Collet.

## 2018-04-29 DIAGNOSIS — R251 Tremor, unspecified: Secondary | ICD-10-CM | POA: Insufficient documentation

## 2018-04-29 NOTE — Assessment & Plan Note (Signed)
Unclear source.  No emergent symptoms.  Discussed differential and options. This may be a combination of (or exacerbated by) caffeine and relatively low sugar/calorie intake in the AMs.   Gradually cut back on caffeine, don't quit cold [place].   He'll try to get some protein (nuts, etc) for breakfast.  Check routine labs today. If he continues to have symptoms without any obvious cause identified then we can have him see neurology.  He agrees. No ominous findings at this point.  Okay for outpatient follow-up.

## 2018-05-11 ENCOUNTER — Encounter: Payer: Self-pay | Admitting: Family Medicine

## 2018-05-12 ENCOUNTER — Ambulatory Visit
Admission: RE | Admit: 2018-05-12 | Discharge: 2018-05-12 | Disposition: A | Discharge status: Home / Self Care | Payer: No Typology Code available for payment source | Source: Ambulatory Visit | Attending: Nurse Practitioner | Admitting: Nurse Practitioner

## 2018-05-12 ENCOUNTER — Encounter: Payer: Self-pay | Admitting: Family Medicine

## 2018-05-12 ENCOUNTER — Other Ambulatory Visit: Payer: Self-pay | Admitting: Nurse Practitioner

## 2018-05-12 DIAGNOSIS — Z021 Encounter for pre-employment examination: Secondary | ICD-10-CM | POA: Diagnosis not present

## 2019-02-15 ENCOUNTER — Other Ambulatory Visit: Payer: Self-pay

## 2019-02-15 ENCOUNTER — Other Ambulatory Visit (INDEPENDENT_AMBULATORY_CARE_PROVIDER_SITE_OTHER): Payer: No Typology Code available for payment source

## 2019-02-15 ENCOUNTER — Other Ambulatory Visit: Payer: Self-pay | Admitting: Family Medicine

## 2019-02-15 DIAGNOSIS — R251 Tremor, unspecified: Secondary | ICD-10-CM

## 2019-02-15 LAB — BASIC METABOLIC PANEL
BUN: 14 mg/dL (ref 6–23)
CO2: 27 mEq/L (ref 19–32)
Calcium: 9.4 mg/dL (ref 8.4–10.5)
Chloride: 105 mEq/L (ref 96–112)
Creatinine, Ser: 0.83 mg/dL (ref 0.40–1.50)
GFR: 113.23 mL/min (ref >60.00)
Glucose, Bld: 80 mg/dL (ref 70–99)
Potassium: 4.3 mEq/L (ref 3.5–5.1)
Sodium: 140 mEq/L (ref 135–145)

## 2019-02-15 LAB — TSH: TSH: 1.69 u[IU]/mL (ref 0.35–4.50)

## 2019-02-22 ENCOUNTER — Ambulatory Visit (INDEPENDENT_AMBULATORY_CARE_PROVIDER_SITE_OTHER): Payer: No Typology Code available for payment source | Admitting: Family Medicine

## 2019-02-22 ENCOUNTER — Encounter: Payer: Self-pay | Admitting: Family Medicine

## 2019-02-22 ENCOUNTER — Other Ambulatory Visit: Payer: Self-pay

## 2019-02-22 VITALS — BP 102/62 | HR 66 | Temp 98.0°F | Ht 66.0 in | Wt 136.5 lb

## 2019-02-22 DIAGNOSIS — Z7189 Other specified counseling: Secondary | ICD-10-CM

## 2019-02-22 DIAGNOSIS — Z0000 Encounter for general adult medical examination without abnormal findings: Secondary | ICD-10-CM

## 2019-02-22 DIAGNOSIS — R251 Tremor, unspecified: Secondary | ICD-10-CM

## 2019-02-22 NOTE — Patient Instructions (Signed)
Thanks for getting a flu shot.  Update me as needed.  Take care.  Glad to see you.

## 2019-02-22 NOTE — Progress Notes (Signed)
CPE- See plan.  Routine anticipatory guidance given to patient.  See health maintenance.  The possibility exists that previously documented standard health maintenance information may have been brought forward from a previous encounter into this note.  If needed, that same information has been updated to reflect the current situation based on today's encounter.    Routine anticipatory guidance given to patient.  See health maintenance. Flu 2020 tdap 2016 PNA and shingles note due.   Diet and exercise d/w pt.  Colon/prostate cancer screening not due.  Labs wnl, d/w pt.   Intermittent tremor noted.  Not worse than prev.  Seems to be worse if prolonged fasting and he is working limit that with frequent meals/etc.  He cut back on coffee.  TSH wnl.  BMET unremarkable.     Wife designated if patient were incapacitated.    PMH and SH reviewed  Meds, vitals, and allergies reviewed.   ROS: Per HPI.  Unless specifically indicated otherwise in HPI, the patient denies:  General: fever. Eyes: acute vision changes ENT: sore throat Cardiovascular: chest pain Respiratory: SOB GI: vomiting GU: dysuria Musculoskeletal: acute back pain Derm: acute rash Neuro: acute motor dysfunction Psych: worsening mood Endocrine: polydipsia Heme: bleeding Allergy: hayfever  GEN: nad, alert and oriented HEENT: ncat NECK: supple w/o LA CV: rrr. PULM: ctab, no inc wob ABD: soft, +bs EXT: no edema SKIN: no acute rash No tremor noted on exam

## 2019-02-25 DIAGNOSIS — Z7189 Other specified counseling: Secondary | ICD-10-CM | POA: Insufficient documentation

## 2019-02-25 NOTE — Assessment & Plan Note (Signed)
  Intermittent tremor noted.  Not worse than prev.  Seems to be worse if prolonged fasting and he is working limit that with frequent meals/etc.  He cut back on coffee.  TSH wnl.  BMET unremarkable.   He will update me as needed.

## 2019-02-25 NOTE — Assessment & Plan Note (Signed)
Flu 2020 tdap 2016 PNA and shingles note due.   Diet and exercise d/w pt.  Colon/prostate cancer screening not due.  Labs wnl, d/w pt. Wife designated if patient were incapacitated.

## 2019-02-25 NOTE — Assessment & Plan Note (Signed)
Wife designated if patient were incapacitated.  

## 2019-05-27 ENCOUNTER — Ambulatory Visit: Payer: No Typology Code available for payment source | Attending: Internal Medicine

## 2019-05-27 DIAGNOSIS — Z20822 Contact with and (suspected) exposure to covid-19: Secondary | ICD-10-CM

## 2019-05-29 LAB — NOVEL CORONAVIRUS, NAA: SARS-CoV-2, NAA: NOT DETECTED

## 2019-06-15 ENCOUNTER — Ambulatory Visit: Payer: No Typology Code available for payment source | Attending: Internal Medicine

## 2019-06-15 DIAGNOSIS — Z20822 Contact with and (suspected) exposure to covid-19: Secondary | ICD-10-CM

## 2019-06-16 LAB — NOVEL CORONAVIRUS, NAA: SARS-CoV-2, NAA: DETECTED — AB

## 2019-06-18 ENCOUNTER — Encounter: Payer: Self-pay | Admitting: Family Medicine

## 2019-08-03 ENCOUNTER — Telehealth: Payer: Self-pay | Admitting: Family Medicine

## 2019-08-03 DIAGNOSIS — Z1283 Encounter for screening for malignant neoplasm of skin: Secondary | ICD-10-CM

## 2019-08-03 DIAGNOSIS — L989 Disorder of the skin and subcutaneous tissue, unspecified: Secondary | ICD-10-CM

## 2019-08-03 NOTE — Telephone Encounter (Signed)
Referral ordered.  Thanks

## 2019-08-03 NOTE — Telephone Encounter (Signed)
Pt needs referrral for insurance he has [employer]   Appointment 08/04/19 with Dr Girtha Rm for yearly check dermatology specialty [phone]

## 2020-02-17 ENCOUNTER — Other Ambulatory Visit: Payer: No Typology Code available for payment source

## 2020-02-17 ENCOUNTER — Other Ambulatory Visit: Payer: Self-pay | Admitting: Family Medicine

## 2020-02-17 DIAGNOSIS — R251 Tremor, unspecified: Secondary | ICD-10-CM

## 2020-02-21 ENCOUNTER — Other Ambulatory Visit (INDEPENDENT_AMBULATORY_CARE_PROVIDER_SITE_OTHER): Payer: No Typology Code available for payment source

## 2020-02-21 ENCOUNTER — Other Ambulatory Visit: Payer: Self-pay

## 2020-02-21 DIAGNOSIS — R251 Tremor, unspecified: Secondary | ICD-10-CM

## 2020-02-21 LAB — GLUCOSE, RANDOM: Glucose, Bld: 80 mg/dL (ref 70–99)

## 2020-02-21 LAB — TSH: TSH: 3.82 u[IU]/mL (ref 0.35–4.50)

## 2020-02-24 ENCOUNTER — Ambulatory Visit (INDEPENDENT_AMBULATORY_CARE_PROVIDER_SITE_OTHER): Payer: No Typology Code available for payment source | Admitting: Family Medicine

## 2020-02-24 ENCOUNTER — Encounter: Payer: Self-pay | Admitting: Family Medicine

## 2020-02-24 ENCOUNTER — Other Ambulatory Visit: Payer: Self-pay

## 2020-02-24 VITALS — BP 92/62 | HR 71 | Temp 97.5°F | Ht 67.0 in | Wt 131.3 lb

## 2020-02-24 DIAGNOSIS — Z23 Encounter for immunization: Secondary | ICD-10-CM

## 2020-02-24 DIAGNOSIS — Z0000 Encounter for general adult medical examination without abnormal findings: Secondary | ICD-10-CM | POA: Diagnosis not present

## 2020-02-24 DIAGNOSIS — R251 Tremor, unspecified: Secondary | ICD-10-CM

## 2020-02-24 DIAGNOSIS — Z7189 Other specified counseling: Secondary | ICD-10-CM

## 2020-02-24 NOTE — Progress Notes (Signed)
This visit occurred during the SARS-CoV-2 public health emergency.  Safety protocols were in place, including screening questions prior to the visit, additional usage of staff PPE, and extensive cleaning of exam room while observing appropriate contact time as indicated for disinfecting solutions.  CPE- See plan.  Routine anticipatory guidance given to patient.  See health maintenance.  The possibility exists that previously documented standard health maintenance information may have been brought forward from a previous encounter into this note.  If needed, that same information has been updated to reflect the current situation based on today's encounter.    Flu 2021 tdap 2016 covid vaccine d/w pt.   PNA and shingles note due.   Diet and exercise d/w pt.  Colon/prostate cancer screening not due.  Labs wnl, d/w pt. Wife designated if patient were incapacitated.    Intermittent tremor noted.  Still not worse than prev.  TSH wnl.  He will update me as needed.  No FH of similar tremor.  No symptoms currently.  PMH and SH reviewed Meds, vitals, and allergies reviewed.   ROS: Per HPI.  Unless specifically indicated otherwise in HPI, the patient denies:  General: fever. Eyes: acute vision changes ENT: sore throat Cardiovascular: chest pain Respiratory: SOB GI: vomiting GU: dysuria Musculoskeletal: acute back pain Derm: acute rash Neuro: acute motor dysfunction Psych: worsening mood Endocrine: polydipsia Heme: bleeding Allergy: hayfever  GEN: nad, alert and oriented HEENT: ncat NECK: supple w/o LA CV: rrr. PULM: ctab, no inc wob ABD: soft, +bs EXT: no edema SKIN: no acute rash No tremor on exam, resting or otherwise.

## 2020-02-24 NOTE — Patient Instructions (Signed)
Update me as needed.  Take care.  Glad to see you.   

## 2020-02-28 NOTE — Assessment & Plan Note (Signed)
Wife designated if patient were incapacitated.  

## 2020-02-28 NOTE — Assessment & Plan Note (Signed)
Flu 2021 tdap 2016 covid vaccine d/w pt.   PNA and shingles note due.   Diet and exercise d/w pt.  Colon/prostate cancer screening not due.  Labs wnl, d/w pt. Wife designated if patient were incapacitated.

## 2020-02-28 NOTE — Assessment & Plan Note (Signed)
No tremor noted on exam.  He will update me as needed.  He agrees.

## 2020-04-15 ENCOUNTER — Encounter: Payer: Self-pay | Admitting: Family Medicine

## 2020-04-18 ENCOUNTER — Encounter: Payer: Self-pay | Admitting: Family Medicine

## 2020-04-18 ENCOUNTER — Ambulatory Visit (INDEPENDENT_AMBULATORY_CARE_PROVIDER_SITE_OTHER): Payer: No Typology Code available for payment source | Admitting: Family Medicine

## 2020-04-18 ENCOUNTER — Other Ambulatory Visit: Payer: Self-pay

## 2020-04-18 ENCOUNTER — Telehealth: Payer: Self-pay | Admitting: Family Medicine

## 2020-04-18 VITALS — BP 100/62 | HR 72 | Temp 98.3°F | Ht 67.0 in | Wt 136.5 lb

## 2020-04-18 DIAGNOSIS — W57XXXA Bitten or stung by nonvenomous insect and other nonvenomous arthropods, initial encounter: Secondary | ICD-10-CM

## 2020-04-18 DIAGNOSIS — S70361A Insect bite (nonvenomous), right thigh, initial encounter: Secondary | ICD-10-CM | POA: Diagnosis not present

## 2020-04-18 NOTE — Telephone Encounter (Signed)
Thanks

## 2020-04-18 NOTE — Progress Notes (Signed)
° ° °  Cesilia Shinn T. Sufyaan Palma, MD, [place] at [facility] [place], [address]  Phone: [phone]   FAX: [place] - 24 y.o. male   MRN [id number]   Date of Birth: 02/28/1996  Date: 04/18/2020   PCP: Tonia Ghent, MD   Referral: Tonia Ghent, MD  Chief Complaint  Patient presents with   Tick Removal    Under Right Thigh    This visit occurred during the SARS-CoV-2 public health emergency.  Safety protocols were in place, including screening questions prior to the visit, additional usage of staff PPE, and extensive cleaning of exam room while observing appropriate contact time as indicated for disinfecting solutions.   Subjective:   Eduardo Whitaker is a 24 y.o. very pleasant male patient with Body mass index is 21.38 kg/m. who presents with the following:  Total encounter time: 20 minutes. This includes total time spent on the day of encounter.  Total procedural time includes prep, evaluation, procedural time.  Also had an additional 6 minutes that was required approximately 4 good hemostasis with pressure.  R thigh tick removal: Right posterior thigh, embedded tick.  Lidocaine and scalpel  Procedure: Tick removal Consent obtained. Lidocaine 1%, 1 cc.  For anesthesia Indication: Embedded tick. Patient was prepped with ChloraPrep and approximately 1 cc lidocaine 1% was used to create a wheal.  Subsequently a #15 blade was used to unearth the skin and tick.  I did have to use the scalpel with multiple passes to fully eliminate the tick. Plain pickups were used and the tick was removed easily.  Good hemostasis with pressure.  Electronically Signed  By: Owens Loffler, MD On: 04/18/2020 5:29 PM

## 2020-04-18 NOTE — Telephone Encounter (Signed)
Please triage patient re: tick bite.  Thanks.

## 2020-04-18 NOTE — Telephone Encounter (Signed)
I spoke with pt; pt removed large tick from back of rt leg on 04/15/20. Pt removed the tick and did not get the head of the tick. Pt does not have fever, chills, rash, H/A,fatigue,swollen lymph nodes,muscle aches and pt does not have any covid symptoms. Pt scheduled in office appt with Dr Lorelei Pont 04/18/20 at 9:40. FYI to Dr Damita Dunnings as PCP and Dr Lorelei Pont

## 2020-05-30 ENCOUNTER — Encounter: Payer: Self-pay | Admitting: Family Medicine

## 2020-10-02 ENCOUNTER — Encounter: Payer: Self-pay | Admitting: Family Medicine

## 2020-10-03 ENCOUNTER — Other Ambulatory Visit: Payer: Self-pay | Admitting: Family Medicine

## 2020-10-03 DIAGNOSIS — Z1283 Encounter for screening for malignant neoplasm of skin: Secondary | ICD-10-CM

## 2020-10-03 DIAGNOSIS — L989 Disorder of the skin and subcutaneous tissue, unspecified: Secondary | ICD-10-CM

## 2020-10-06 NOTE — Telephone Encounter (Signed)
Is there anything that can be done to help the patient?  Please see mychart message.  Thanks.

## 2020-10-15 ENCOUNTER — Encounter: Payer: Self-pay | Admitting: Family Medicine

## 2020-10-17 ENCOUNTER — Other Ambulatory Visit (HOSPITAL_COMMUNITY): Payer: Self-pay

## 2020-10-17 ENCOUNTER — Ambulatory Visit (INDEPENDENT_AMBULATORY_CARE_PROVIDER_SITE_OTHER): Payer: No Typology Code available for payment source | Admitting: Family Medicine

## 2020-10-17 ENCOUNTER — Other Ambulatory Visit: Payer: Self-pay

## 2020-10-17 ENCOUNTER — Encounter: Payer: Self-pay | Admitting: Family Medicine

## 2020-10-17 DIAGNOSIS — J0100 Acute maxillary sinusitis, unspecified: Secondary | ICD-10-CM | POA: Diagnosis not present

## 2020-10-17 DIAGNOSIS — J019 Acute sinusitis, unspecified: Secondary | ICD-10-CM | POA: Insufficient documentation

## 2020-10-17 MED ORDER — AMOXICILLIN-POT CLAVULANATE 875-125 MG PO TABS
1.0000 | ORAL_TABLET | Freq: Two times a day (BID) | ORAL | 0 refills | Status: DC
  Filled 2020-10-17: qty 14, 7d supply, fill #0

## 2020-10-17 NOTE — Assessment & Plan Note (Signed)
10 days into uri with increasing facial pain and nasal congestion and some cough  Neg covid test at home  Adv to start using nasal saline Can add flonse if needed Continue mucinex  Sudafed ok it helpful  augmentin sent to pharmacy  Update if not starting to improve in a week or if worsening  -esp if wheeze or worse cough

## 2020-10-17 NOTE — Patient Instructions (Signed)
Drink lots of fluids Continue mucinex  Take augmentin as directed  Sudafed is fine if helpful  flonase may help also  Update if not starting to improve in a week or if worsening  Especially if wheezing   Go to the ER if you suddenly have trouble breathing

## 2020-10-17 NOTE — Progress Notes (Signed)
Virtual Visit via Video Note  I connected with Eduardo Whitaker on 10/17/20 at 12:00 PM EDT by a video enabled telemedicine application and verified that I am speaking with the correct person using two identifiers.  Location: Patient: home  Provider: office    I discussed the limitations of evaluation and management by telemedicine and the availability of in person appointments. The patient expressed understanding and agreed to proceed.  Parties involved in encounter  Patient: Eduardo Whitaker  Provider:  Loura Pardon MD   History of Present Illness: 25 yo pt of Dr Damita Dunnings presents with nasal congestion and facial pain   Symptoms started over a week ago   Nasal congestion -was clear and now yellow/green  Facial pain under eyes  occ forehead area  Some chest congestion also -hard to get up  Drinking fluids   A little sob with exertion - a little wheeze  Some hoarseness   No n/v  Sore throat off and on with pnd  achey but no fever  Cough-occ phlegm  Does not think he needs an inhaler   Did a covid test-negative   No drug allergies   otc Sudafed mucinex  Ibuprofen for headache     he had covid in jan of 2021  Patient Active Problem List   Diagnosis Date Noted  . Acute sinusitis 10/17/2020  . Advance care planning 02/25/2019  . Tremor 04/29/2018   Past Medical History:  Diagnosis Date  . Atypical nevus    prev removed by derm   History reviewed. No pertinent surgical history. Social History   Tobacco Use  . Smoking status: Never Smoker  . Smokeless tobacco: Never Used  Substance Use Topics  . Alcohol use: No  . Drug use: No   Family History  Problem Relation Age of Onset  . Depression Mother   . Hypertension Father   . Prostate cancer Maternal Grandfather   . Colon cancer Neg Hx    No Known Allergies No current outpatient medications on file prior to visit.   No current facility-administered medications on file prior to visit.   Review of  Systems  Constitutional: Negative for chills, fever and malaise/fatigue.       Mild body aches  HENT: Positive for congestion, sinus pain and sore throat. Negative for ear pain.   Eyes: Negative for blurred vision, discharge and redness.  Respiratory: Positive for cough and sputum production. Negative for shortness of breath, wheezing and stridor.   Cardiovascular: Negative for chest pain, palpitations and leg swelling.  Gastrointestinal: Negative for abdominal pain, diarrhea, nausea and vomiting.  Musculoskeletal: Negative for myalgias.  Skin: Negative for rash.  Neurological: Positive for headaches. Negative for dizziness.    Observations/Objective: Patient appears well, in no distress Weight is baseline  No facial swelling or asymmetry Some sinus tenderness/maxillary to palp by pt Mildly hoarse voice No obvious tremor or mobility impairment Moving neck and UEs normally Able to hear the call well  No cough or shortness of breath during interview  No audible wheeze Talkative and mentally sharp with no cognitive changes No skin changes on face or neck , no rash or pallor Affect is normal    Assessment and Plan: Problem List Items Addressed This Visit      Respiratory   Acute sinusitis    10 days into uri with increasing facial pain and nasal congestion and some cough  Neg covid test at home  Adv to start using nasal saline Can add flonse if  needed Continue mucinex  Sudafed ok it helpful  augmentin sent to pharmacy  Update if not starting to improve in a week or if worsening  -esp if wheeze or worse cough       Relevant Medications   amoxicillin-clavulanate (AUGMENTIN) 875-125 MG tablet       Follow Up Instructions: Drink lots of fluids Continue mucinex  Take augmentin as directed  Sudafed is fine if helpful  flonase may help also  Update if not starting to improve in a week or if worsening  Especially if wheezing   Go to the ER if you suddenly have trouble  breathing   I discussed the assessment and treatment plan with the patient. The patient was provided an opportunity to ask questions and all were answered. The patient agreed with the plan and demonstrated an understanding of the instructions.   The patient was advised to call back or seek an in-person evaluation if the symptoms worsen or if the condition fails to improve as anticipated.   Loura Pardon, MD

## 2020-10-30 ENCOUNTER — Telehealth: Payer: Self-pay | Admitting: *Deleted

## 2020-10-30 NOTE — Telephone Encounter (Addendum)
Patient called stating that he had a sinus infection several weeks ago and was given an antibiotic. Patient stated that he has some tenderness around the left side of his nose and the corner of his eye. Patient stated he does not have pain in the area. Patient stated that the area is just ender to the touch and that started over a week ago. Patient stated that he has been thinking that it would just go away but it has not. Patient wants to know what Dr. Damita Dunnings would recommend. Patient denies a fever or cough. Pharmacy [facility]

## 2020-10-30 NOTE — Telephone Encounter (Signed)
Advised patient to use warm compresses on the area and if no better after a couple days then to call back for an in person eval. Patient agrees.

## 2020-10-30 NOTE — Telephone Encounter (Signed)
I would try warm compresses on the area just to see if that helps.  If this isn't getting better, then we may need to check him in person.  Thanks.

## 2021-04-17 ENCOUNTER — Encounter: Payer: No Typology Code available for payment source | Admitting: Family Medicine

## 2021-11-13 ENCOUNTER — Encounter: Payer: Self-pay | Admitting: Family Medicine

## 2021-11-13 ENCOUNTER — Ambulatory Visit (INDEPENDENT_AMBULATORY_CARE_PROVIDER_SITE_OTHER): Payer: No Typology Code available for payment source | Admitting: Family Medicine

## 2021-11-13 VITALS — BP 104/70 | HR 70 | Temp 97.8°F | Ht 67.0 in | Wt 137.2 lb

## 2021-11-13 DIAGNOSIS — Z0000 Encounter for general adult medical examination without abnormal findings: Secondary | ICD-10-CM | POA: Diagnosis not present

## 2021-11-13 DIAGNOSIS — Z7189 Other specified counseling: Secondary | ICD-10-CM

## 2021-11-13 NOTE — Progress Notes (Unsigned)
CPE- See plan.  Routine anticipatory guidance given to patient.  See health maintenance.  The possibility exists that previously documented standard health maintenance information may have been brought forward from a previous encounter into this note.  If needed, that same information has been updated to reflect the current situation based on today's encounter.    Tetanus 2016 Flu prev done.   PNA and shingles d/w pt  Colon and prostate screening not due.  Living will d/w pt.  Wife designated if patient were incapacitated.   Diet and exercise d/w pt.    He has routine derm f/u.    No recent tremor noted.  It may have been related to taking caffeine on an empty stomach.    PMH and SH reviewed  Meds, vitals, and allergies reviewed.   ROS: Per HPI.  Unless specifically indicated otherwise in HPI, the patient denies:  General: fever. Eyes: acute vision changes ENT: sore throat Cardiovascular: chest pain Respiratory: SOB GI: vomiting GU: dysuria Musculoskeletal: acute back pain Derm: acute rash Neuro: acute motor dysfunction Psych: worsening mood Endocrine: polydipsia Heme: bleeding Allergy: hayfever  GEN: nad, alert and oriented HEENT: mucous membranes moist NECK: supple w/o LA CV: rrr. PULM: ctab, no inc wob ABD: soft, +bs EXT: no edema SKIN: no acute rash

## 2021-11-13 NOTE — Patient Instructions (Signed)
Update me as needed.  Take care.  Glad to see you. Thanks for your effort.   

## 2021-11-14 DIAGNOSIS — Z7189 Other specified counseling: Secondary | ICD-10-CM | POA: Insufficient documentation

## 2021-11-14 NOTE — Assessment & Plan Note (Signed)
Tetanus 2016 Flu prev done.   PNA and shingles d/w pt  COVID-vaccine discussed. Colon and prostate screening not due.  Living will d/w pt.  Wife designated if patient were incapacitated.   Diet and exercise d/w pt.    He has routine derm f/u.    Reasonable to defer labs at this point.  He agreed.  No recent tremor noted.  It may have been related to taking caffeine on an empty stomach.

## 2021-11-14 NOTE — Assessment & Plan Note (Signed)
Living will d/w pt.  Wife designated if patient were incapacitated.   ?

## 2022-01-03 ENCOUNTER — Ambulatory Visit (INDEPENDENT_AMBULATORY_CARE_PROVIDER_SITE_OTHER): Payer: No Typology Code available for payment source | Admitting: Nurse Practitioner

## 2022-01-03 ENCOUNTER — Encounter: Payer: Self-pay | Admitting: Nurse Practitioner

## 2022-01-03 VITALS — BP 120/68 | HR 65 | Temp 97.7°F | Ht 67.0 in | Wt 137.0 lb

## 2022-01-03 DIAGNOSIS — R221 Localized swelling, mass and lump, neck: Secondary | ICD-10-CM | POA: Diagnosis not present

## 2022-01-03 LAB — CBC WITH DIFFERENTIAL/PLATELET
Basophils Absolute: 0 10*3/uL (ref 0.0–0.1)
Basophils Relative: 0.4 % (ref 0.0–3.0)
Eosinophils Absolute: 0.3 10*3/uL (ref 0.0–0.7)
Eosinophils Relative: 4.1 % (ref 0.0–5.0)
HCT: 44.9 % (ref 39.0–52.0)
Hemoglobin: 14.4 g/dL (ref 13.0–17.0)
Lymphocytes Relative: 24.7 % (ref 12.0–46.0)
Lymphs Abs: 1.8 10*3/uL (ref 0.7–4.0)
MCHC: 32.2 g/dL (ref 30.0–36.0)
MCV: 81 fl (ref 78.0–100.0)
Monocytes Absolute: 0.8 10*3/uL (ref 0.1–1.0)
Monocytes Relative: 11.1 % (ref 3.0–12.0)
Neutro Abs: 4.2 10*3/uL (ref 1.4–7.7)
Neutrophils Relative %: 59.7 % (ref 43.0–77.0)
Platelets: 229 10*3/uL (ref 150.0–400.0)
RBC: 5.54 Mil/uL (ref 4.22–5.81)
RDW: 13.8 % (ref 11.5–15.5)
WBC: 7.1 10*3/uL (ref 4.0–10.5)

## 2022-01-03 NOTE — Patient Instructions (Signed)
Nice to see you today We will check a CBC in office today. I will be in touch with the results once I have reviewed them If this continue over the nest 1.5-2 weeks let me know and we can get an ultrasound of the area. You can message me or Dr. Damita Dunnings on mychart Follow up if no improvement

## 2022-01-03 NOTE — Progress Notes (Signed)
   Acute Office Visit  Subjective:     Patient ID: Eduardo Whitaker, male    DOB: [birth year], 26 y.o.   MRN: [id number]  Chief Complaint  Patient presents with   Acute Visit    Lump on neck/collarbone area x 2 days. No pain or drainage    HPI Patient is in today for Mass  States that he noticed what he thinks it is a lymph node. Noticed it approx 2 weeks ago and noticed it. No pain itching or discharge. States that for the past couple weeks on and off. Has not tried anything over the counter. Patient denies personal history of any cancer.  No family history of cancers minus a grandfather with prostate cancer patient denies personal or family histories of lymphoma and/or leukemia.   Review of Systems  Constitutional:  Positive for malaise/fatigue (sleep has been off). Negative for chills and fever.  Respiratory:  Negative for cough and shortness of breath.   Cardiovascular:  Negative for chest pain.  Gastrointestinal:  Negative for abdominal pain, diarrhea, nausea and vomiting.       BM daily    Neurological:  Negative for dizziness and headaches.        Objective:    BP 120/68 (BP Location: Left Arm, Patient Position: Sitting, Cuff Size: Normal)   Pulse 65   Temp 97.7 F (36.5 C) (Temporal)   Ht '5\' 7"'$  (1.702 m)   Wt 137 lb (62.1 kg)   SpO2 99%   BMI 21.46 kg/m    Physical Exam Vitals and nursing note reviewed.  Constitutional:      Appearance: Normal appearance.  Neck:   Cardiovascular:     Rate and Rhythm: Normal rate and regular rhythm.     Heart sounds: Normal heart sounds.  Pulmonary:     Effort: Pulmonary effort is normal.     Breath sounds: Normal breath sounds.  Lymphadenopathy:     Cervical: Cervical adenopathy present.  Neurological:     Mental Status: He is alert.     No results found for any visits on 01/03/22.      Assessment & Plan:   Problem List Items Addressed This Visit       Other   Mass in neck - Primary    Presumably  incidental lymphadenopathy.  Been around for 2 days.  Did discuss watchful waiting for approximately 2 weeks if it grows in size, gets tender, or changes patient will alert [place] we will get ultrasound the area.  Will check CBC with differential today.  Follow-up if no improvement      Relevant Orders   CBC with Differential/Platelet    No orders of the defined types were placed in this encounter.   Return if symptoms worsen or fail to improve.  Romilda Garret, NP

## 2022-01-03 NOTE — Assessment & Plan Note (Signed)
Presumably incidental lymphadenopathy.  Been around for 2 days.  Did discuss watchful waiting for approximately 2 weeks if it grows in size, gets tender, or changes patient will alert [place] we will get ultrasound the area.  Will check CBC with differential today.  Follow-up if no improvement

## 2022-08-08 DIAGNOSIS — D2272 Melanocytic nevi of left lower limb, including hip: Secondary | ICD-10-CM | POA: Diagnosis not present

## 2022-08-08 DIAGNOSIS — D2230 Melanocytic nevi of unspecified part of face: Secondary | ICD-10-CM | POA: Diagnosis not present

## 2022-08-08 DIAGNOSIS — D225 Melanocytic nevi of trunk: Secondary | ICD-10-CM | POA: Diagnosis not present

## 2022-08-08 DIAGNOSIS — L578 Other skin changes due to chronic exposure to nonionizing radiation: Secondary | ICD-10-CM | POA: Diagnosis not present

## 2022-08-08 DIAGNOSIS — D224 Melanocytic nevi of scalp and neck: Secondary | ICD-10-CM | POA: Diagnosis not present

## 2022-08-08 DIAGNOSIS — D2262 Melanocytic nevi of left upper limb, including shoulder: Secondary | ICD-10-CM | POA: Diagnosis not present

## 2022-08-08 DIAGNOSIS — D2261 Melanocytic nevi of right upper limb, including shoulder: Secondary | ICD-10-CM | POA: Diagnosis not present

## 2022-08-08 DIAGNOSIS — Z86018 Personal history of other benign neoplasm: Secondary | ICD-10-CM | POA: Diagnosis not present

## 2022-11-08 ENCOUNTER — Other Ambulatory Visit: Payer: Self-pay | Admitting: Family Medicine

## 2022-11-08 DIAGNOSIS — Z131 Encounter for screening for diabetes mellitus: Secondary | ICD-10-CM

## 2022-11-11 ENCOUNTER — Other Ambulatory Visit (INDEPENDENT_AMBULATORY_CARE_PROVIDER_SITE_OTHER): Payer: 59

## 2022-11-11 DIAGNOSIS — Z131 Encounter for screening for diabetes mellitus: Secondary | ICD-10-CM

## 2022-11-11 LAB — GLUCOSE, RANDOM: Glucose, Bld: 84 mg/dL (ref 70–99)

## 2022-11-18 ENCOUNTER — Encounter: Payer: Self-pay | Admitting: Family Medicine

## 2022-11-18 ENCOUNTER — Ambulatory Visit (INDEPENDENT_AMBULATORY_CARE_PROVIDER_SITE_OTHER): Payer: 59 | Admitting: Family Medicine

## 2022-11-18 VITALS — BP 110/68 | HR 73 | Temp 98.6°F | Ht 67.0 in | Wt 140.0 lb

## 2022-11-18 DIAGNOSIS — R221 Localized swelling, mass and lump, neck: Secondary | ICD-10-CM

## 2022-11-18 DIAGNOSIS — Z0000 Encounter for general adult medical examination without abnormal findings: Secondary | ICD-10-CM | POA: Diagnosis not present

## 2022-11-18 DIAGNOSIS — Z131 Encounter for screening for diabetes mellitus: Secondary | ICD-10-CM

## 2022-11-18 DIAGNOSIS — Z7189 Other specified counseling: Secondary | ICD-10-CM

## 2022-11-18 NOTE — Patient Instructions (Signed)
If the spot on your neck changes at all or if you notice other spots, then let me know.  Take care.  Glad to see you. I would get a flu shot each fall.

## 2022-11-18 NOTE — Progress Notes (Unsigned)
CPE- See plan.  Routine anticipatory guidance given to patient.  See health maintenance.  The possibility exists that previously documented standard health maintenance information may have been brought forward from a previous encounter into this note.  If needed, that same information has been updated to reflect the current situation based on today's encounter.    Tetanus 2016 Flu prev done.   PNA and shingles d/w pt  COVID-vaccine discussed. Colon and prostate screening not due.  Living will d/w pt.  Wife designated if patient were incapacitated.   Diet and exercise d/w pt.   HIV and HCV screening d/w pt.  He can consider.   No recent tremor noted.  It may have been related to taking caffeine on an empty stomach- no sx with coffee if he has had food.   Lesion on the neck.  Small <1cm mass near the L clavicle.  Hasn't enlarged. Not painful.  Didn't resolve but didn't change.  No other lesion.  He still feels well.  No overlying skin changes.  He is still able to exercise.  No "B" sx.    PMH and SH reviewed  Meds, vitals, and allergies reviewed.   ROS: Per HPI.  Unless specifically indicated otherwise in HPI, the patient denies:  General: fever. Eyes: acute vision changes ENT: sore throat Cardiovascular: chest pain Respiratory: SOB GI: vomiting GU: dysuria Musculoskeletal: acute back pain Derm: acute rash Neuro: acute motor dysfunction Psych: worsening mood Endocrine: polydipsia Heme: bleeding Allergy: hayfever  GEN: nad, alert and oriented HEENT: mucous membranes moist ,TM wnl.   NECK: supple w/o LA except for Small <1cm mass near the L clavicle-this feels like a small nontender isolated lymph node without any overlying skin changes. CV: rrr. PULM: ctab, no inc wob ABD: soft, +bs EXT: no edema SKIN: no acute rash

## 2022-11-20 NOTE — Assessment & Plan Note (Signed)
Living will d/w pt.  Wife designated if patient were incapacitated.   ?

## 2022-11-20 NOTE — Assessment & Plan Note (Signed)
Small <1cm mass near the L clavicle-this feels like a small nontender isolated lymph node without any overlying skin changes.  He does not have any other clavicular or neck lymphadenopathy that I can feel.  Since the lesion is small and he feels well otherwise I think it makes sense to continue with observation.  If he notices any other symptoms or change in size then he can let me know.  I suspect that excisional biopsy not be indicated given his exam and history.  He agrees with plan.

## 2022-11-20 NOTE — Assessment & Plan Note (Signed)
Tetanus 2016 Flu prev done.   PNA and shingles d/w pt  COVID-vaccine discussed. Colon and prostate screening not due.  Living will d/w pt.  Wife designated if patient were incapacitated.   Diet and exercise d/w pt.   HIV and HCV screening d/w pt.  He can consider.

## 2023-11-20 ENCOUNTER — Encounter: Payer: 59 | Admitting: Family Medicine

## 2023-11-25 ENCOUNTER — Ambulatory Visit (INDEPENDENT_AMBULATORY_CARE_PROVIDER_SITE_OTHER): Admitting: Family Medicine

## 2023-11-25 ENCOUNTER — Encounter: Payer: Self-pay | Admitting: Family Medicine

## 2023-11-25 VITALS — BP 106/64 | HR 76 | Temp 98.5°F | Ht 66.73 in | Wt 143.2 lb

## 2023-11-25 DIAGNOSIS — Z23 Encounter for immunization: Secondary | ICD-10-CM | POA: Diagnosis not present

## 2023-11-25 DIAGNOSIS — R221 Localized swelling, mass and lump, neck: Secondary | ICD-10-CM

## 2023-11-25 DIAGNOSIS — Z0000 Encounter for general adult medical examination without abnormal findings: Secondary | ICD-10-CM | POA: Diagnosis not present

## 2023-11-25 DIAGNOSIS — Z7189 Other specified counseling: Secondary | ICD-10-CM

## 2023-11-25 NOTE — Progress Notes (Unsigned)
 CPE- See plan.  Routine anticipatory guidance given to patient.  See health maintenance.  The possibility exists that previously documented standard health maintenance information may have been brought forward from a previous encounter into this note.  If needed, that same information has been updated to reflect the current situation based on today's encounter.    Tetanus 2025 Flu prev done.   PNA and shingles d/w pt  COVID-vaccine prev discussed. Colon and prostate screening not due.  Living will d/w pt.  Wife designated if patient were incapacitated.   Diet and exercise d/w pt.   HIV and HCV screening d/w pt.  He politely declined at this point, low risk.   He and his wife are having a daughter, due in 02/2024.  D/w pt.    D/w pt about prev possible small neck mass.  I don't feel anything in the area today.  He feels well.    Recent nevus removed, with f/u procedure pending.  It was not melanoma.    PMH and SH reviewed  Meds, vitals, and allergies reviewed.   ROS: Per HPI.  Unless specifically indicated otherwise in HPI, the patient denies:  General: fever. Eyes: acute vision changes ENT: sore throat Cardiovascular: chest pain Respiratory: SOB GI: vomiting GU: dysuria Musculoskeletal: acute back pain Derm: acute rash Neuro: acute motor dysfunction Psych: worsening mood Endocrine: polydipsia Heme: bleeding Allergy: hayfever  GEN: nad, alert and oriented HEENT: mucous membranes moist NECK: supple w/o LA CV: rrr. PULM: ctab, no inc wob ABD: soft, +bs EXT: no edema SKIN: no acute rash, previous biopsy site appears healed, on the abdominal wall.  No other suspicious lesions noted.

## 2023-11-25 NOTE — Patient Instructions (Signed)
 Tdap today.  I would get a flu shot each fall.   Take care.  Glad to see you.

## 2023-11-26 ENCOUNTER — Encounter: Payer: Self-pay | Admitting: Family Medicine

## 2023-11-26 NOTE — Assessment & Plan Note (Signed)
 I do not feel a mass today.  Resolved.  He can update me as needed.

## 2023-11-26 NOTE — Assessment & Plan Note (Signed)
 Tetanus 2025 Flu prev done.   PNA and shingles d/w pt  COVID-vaccine prev discussed. Colon and prostate screening not due.  Living will d/w pt.  Wife designated if patient were incapacitated.   Diet and exercise d/w pt.   HIV and HCV screening d/w pt.  He politely declined at this point, low risk.   He and his wife are having a daughter, due in 02/2024.  D/w pt.    D/w pt about prev possible small neck mass.  I don't feel anything in the area today.  He feels well.    Recent nevus removed, with f/u procedure pending.  It was not melanoma.

## 2023-11-26 NOTE — Assessment & Plan Note (Signed)
 Living will d/w pt.  Wife designated if patient were incapacitated.   ?

## 2023-11-27 ENCOUNTER — Telehealth: Admitting: Physician Assistant

## 2023-11-27 DIAGNOSIS — Z9189 Other specified personal risk factors, not elsewhere classified: Secondary | ICD-10-CM | POA: Diagnosis not present

## 2023-11-27 MED ORDER — DOXYCYCLINE HYCLATE 100 MG PO TABS: 200.0000 mg | ORAL_TABLET | Freq: Once | ORAL | 0 refills | Status: AC

## 2023-11-27 NOTE — Progress Notes (Signed)
E-Visit for Tick Bite  Thank you for describing your tick bite, Here is how we plan to help! Based on the information that you shared with me it looks like you have An uncomplicated tick bite that just occurred and can be closely follow using the instructions in your care plan.  In most cases a tick bite is painless and does not itch.  Most tick bites in which the tick is quickly removed do not require prescriptions. Ticks can transmit several diseases if they are infected and remain attacked to your skin. Therefore the length that the tick was attached and any symptoms you have experienced after the bite are import to accurately develop your custom treatment plan. In most cases a single dose of doxycycline may prevent the development of a more serious condition.  Based on your information I have Provided a home care guide for tick bites and  instructions on when to call for help. and I have sent a single dose of doxycycline to the pharmacy you selected. Please make sure that you selected a pharmacy that is open now.  Which ticks  are associated with illness?  The Wood Tick (dog tick) is the size of a watermelon seed and can sometimes transmit [facility] spotted fever and [place] tick fever.   The Deer Tick (black-legged tick) is between the size of a poppy seed (pin head) and an apple seed, and can sometimes transmit Lyme disease.  A brown to black tick with a white splotch on its back is likely a male Amblyomma americanum ([place] tick). This tick has been associated with [place] Tick Associated illness ( STARI)  Lyme disease has become the most common tick-borne illness in the [place]. The risk of Lyme disease following a recognized deer tick bite is estimated to be 1%.  The majority of cases of Lyme disease start with a bull's eye rash at the site of the tick bite. The rash can occur days to weeks (typically 7-10 days) after a tick bite. Treatment with antibiotics is  indicated if this rash appears. Flu-like symptoms may accompany the rash, including: fever, chills, headaches, muscle aches, and fatigue. Removing ticks promptly may prevent tick borne disease.  What can be used to prevent Tick Bites?  Insect repellant with at leas 20% DEET. Wearing long pants with sock and shoes. Avoiding tall grass and heavily wooded areas. Checking your skin after being outdoors. Shower with a washcloth after outdoor exposures.  HOME CARE ADVICE FOR TICK BITE  Wood Tick Removal:  Use a pair of tweezers and grasp the wood tick close to the skin (on its head). Pull the wood tick straight upward without twisting or crushing it. Maintain a steady pressure until it releases its grip.   If tweezers aren't available, use fingers, a loop of thread around the jaws, or a needle between the jaws for traction.  Note: covering the tick with petroleum jelly, nail polish or rubbing alcohol doesn't work. Neither does touching the tick with a hot or cold object. Tiny Deer Tick Removal:   Needs to be scraped off with a knife blade or credit card edge. Place tick in a sealed container (e.g. glass jar, zip lock plastic bag), in case your doctor wants to see it. Tick's Head Removal:  If the wood tick's head breaks off in the skin, it must be removed. Clean the skin. Then use a sterile needle to uncover the head and lift it out or scrape it off.  If a very small piece of the head remains, the skin will eventually slough it off. Antibiotic Ointment:  Wash the wound and your hands with soap and water after removal to prevent catching any tick disease.  Apply an over the counter antibiotic ointment (e.g. bacitracin) to the bite once. Expected Course: Tick bites normally don't itch or hurt. That's why they often go unnoticed. Call Your Doctor If:  You can't remove the tick or the tick's head Fever, a severe head ache, or rash occur in the next 2 weeks Bite begins to look infected Lyme's  disease is common in your area You have not had a tetanus in the last 10 years Your current symptoms become worse    MAKE SURE YOU  Understand these instructions. Will watch your condition. Will get help right away if you are not doing well or get worse.    Thank you for choosing an e-visit.  Your e-visit answers were reviewed by a board certified advanced clinical practitioner to complete your personal care plan. Depending upon the condition, your plan could have included both over the counter or prescription medications.  Please review your pharmacy choice. Make sure the pharmacy is open so you can pick up prescription now. If there is a problem, you may contact your provider through [employer] and have the prescription routed to another pharmacy.  Your safety is important to [place]. If you have drug allergies check your prescription carefully.   For the next 24 hours you can use MyChart to ask questions about today's visit, request a non-urgent call back, or ask for a work or school excuse. You will get an email in the next two days asking about your experience. I hope that your e-visit has been valuable and will speed your recovery.  I have spent 5 minutes in review of e-visit questionnaire, review and updating patient chart, medical decision making and response to patient.   Margaretann Loveless, PA-C

## 2024-03-15 ENCOUNTER — Encounter: Payer: Self-pay | Admitting: Family Medicine

## 2024-11-29 ENCOUNTER — Encounter: Admitting: Family Medicine
